# Patient Record
Sex: Male | Born: 1970 | Race: White | Hispanic: No | Marital: Married | Smoking: Former smoker
Health system: Southern US, Community
[De-identification: ages and names within clinical notes are randomized; demographics above are authoritative.]

## PROBLEM LIST (undated history)

## (undated) DIAGNOSIS — I71019 Dissection of thoracic aorta, unspecified: Secondary | ICD-10-CM

## (undated) DIAGNOSIS — J449 Chronic obstructive pulmonary disease, unspecified: Secondary | ICD-10-CM

## (undated) DIAGNOSIS — R011 Cardiac murmur, unspecified: Secondary | ICD-10-CM

## (undated) DIAGNOSIS — E119 Type 2 diabetes mellitus without complications: Secondary | ICD-10-CM

## (undated) DIAGNOSIS — R0600 Dyspnea, unspecified: Secondary | ICD-10-CM

## (undated) DIAGNOSIS — I10 Essential (primary) hypertension: Secondary | ICD-10-CM

## (undated) HISTORY — PX: HAND SURGERY: SHX662

## (undated) HISTORY — DX: Dissection of thoracic aorta, unspecified: I71.019

---

## 1998-05-15 ENCOUNTER — Encounter: Admission: RE | Admit: 1998-05-15 | Discharge: 1998-05-15 | Payer: Self-pay | Admitting: *Deleted

## 2013-07-08 HISTORY — PX: CORONARY ARTERY BYPASS GRAFT: SHX141

## 2016-11-06 ENCOUNTER — Encounter (HOSPITAL_BASED_OUTPATIENT_CLINIC_OR_DEPARTMENT_OTHER): Payer: Self-pay

## 2016-11-06 DIAGNOSIS — R0683 Snoring: Secondary | ICD-10-CM

## 2016-11-06 DIAGNOSIS — G4733 Obstructive sleep apnea (adult) (pediatric): Secondary | ICD-10-CM

## 2016-12-29 ENCOUNTER — Ambulatory Visit (HOSPITAL_BASED_OUTPATIENT_CLINIC_OR_DEPARTMENT_OTHER): Payer: BLUE CROSS/BLUE SHIELD | Attending: Family Medicine | Admitting: Internal Medicine

## 2016-12-29 DIAGNOSIS — R0683 Snoring: Secondary | ICD-10-CM | POA: Diagnosis present

## 2016-12-29 DIAGNOSIS — G4730 Sleep apnea, unspecified: Secondary | ICD-10-CM | POA: Insufficient documentation

## 2016-12-29 DIAGNOSIS — G4733 Obstructive sleep apnea (adult) (pediatric): Secondary | ICD-10-CM

## 2017-01-03 DIAGNOSIS — R0683 Snoring: Secondary | ICD-10-CM | POA: Diagnosis not present

## 2017-01-03 NOTE — Procedures (Signed)
Patient Name: Patrick Woodard, Patrick Woodard Date: 12/29/2016 Gender: Male D.O.B: 01/30/1971 Age (years): 45 Referring Provider: Addison Naegeli MD Height (inches): 68 Interpreting Physician: Baird Lyons MD, ABSM Weight (lbs): 210 RPSGT: Laren Everts BMI: 29 MRN: [id number] Neck Size: 16.50 CLINICAL INFORMATION Sleep Study Type: Split Night CPAP  Indication for sleep study: Fatigue, OSA, Snoring, Witnessed Apneas  Epworth Sleepiness Score: 21  SLEEP STUDY TECHNIQUE As per the AASM Manual for the Scoring of Sleep and Associated Events v2.3 (April 2016) with a hypopnea requiring 4% desaturations.  The channels recorded and monitored were frontal, central and occipital EEG, electrooculogram (EOG), submentalis EMG (chin), nasal and oral airflow, thoracic and abdominal wall motion, anterior tibialis EMG, snore microphone, electrocardiogram, and pulse oximetry. Continuous positive airway pressure (CPAP) was initiated when the patient met split night criteria and was titrated according to treat sleep-disordered breathing.  MEDICATIONS Medications self-administered by patient taken the night of the study : none reported  RESPIRATORY PARAMETERS Diagnostic  Total AHI (/hr): 11.5 RDI (/hr): 22.0 OA Index (/hr): - CA Index (/hr): 6.2 REM AHI (/hr): N/A NREM AHI (/hr): 11.5 Supine AHI (/hr): 15.1 Non-supine AHI (/hr): 7.74 Min O2 Sat (%): 85.00 Mean O2 (%): 91.56 Time below 88% (min): 20.6   Titration  Optimal Pressure (cm): 13 AHI at Optimal Pressure (/hr): 21.6 Min O2 at Optimal Pressure (%): 92.0 Supine % at Optimal (%): 54 Sleep % at Optimal (%): 97   SLEEP ARCHITECTURE The recording time for the entire night was 416.3 minutes.  During a baseline period of 146.4 minutes, the patient slept for 125.5 minutes in REM and nonREM, yielding a sleep efficiency of 85.7%. Sleep onset after lights out was 7.5 minutes with a REM latency of N/A minutes. The patient spent 13.94% of the night in stage  N1 sleep, 86.06% in stage N2 sleep, 0.00% in stage N3 and 0.00% in REM.  During the titration period of 263.4 minutes, the patient slept for 212.5 minutes in REM and nonREM, yielding a sleep efficiency of 80.7%. Sleep onset after CPAP initiation was 10.6 minutes with a REM latency of 31.5 minutes. The patient spent 19.06% of the night in stage N1 sleep, 47.06% in stage N2 sleep, 0.00% in stage N3 and 33.88% in REM.  CARDIAC DATA The 2 lead EKG demonstrated sinus rhythm. The mean heart rate was 70.09 beats per minute. Other EKG findings include: PVCs.  LEG MOVEMENT DATA The total Periodic Limb Movements of Sleep (PLMS) were 0. The PLMS index was 0.00 .  IMPRESSIONS - Mild obstructive sleep apnea occurred during the diagnostic portion of the study (AHI = 11.5 /hour). An optimal PAP pressure was selected for this patient ( 13 cm of water) - Mild central sleep apnea occurred during the diagnostic portion of the study (CAI = 6.2/hour). - The patient had minimal  oxygen desaturation during the diagnostic portion of the study (Min O2 = 85.00%) - No snoring was audible during the diagnostic portion of the study. - EKG findings include PVCs. - Clinically significant periodic limb movements did not occur during sleep.  DIAGNOSIS - Obstructive Sleep Apnea (327.23 [G47.33 ICD-10])  RECOMMENDATIONS - Trial of CPAP therapy on 13 cm H2O with a Medium size Fisher&Paykel Full Face Mask Simplus mask and heated humidification. - Avoid alcohol, sedatives and other CNS depressants that may worsen sleep apnea and disrupt normal sleep architecture. - Sleep hygiene should be reviewed to assess factors that may improve sleep quality. - Weight management and regular exercise should be  initiated or continued.  [Electronically signed] 01/03/2017 11:26 AM  Baird Lyons MD, ABSM Diplomate, American Board of Sleep Medicine   NPI: [id number]  [place], American Board of Sleep  Medicine  ELECTRONICALLY SIGNED ON:  01/03/2017, 11:25 AM [place] PH: [phone]   FX: [phone] [place]

## 2020-09-05 ENCOUNTER — Ambulatory Visit: Payer: Self-pay | Admitting: Surgery

## 2020-12-06 ENCOUNTER — Other Ambulatory Visit (HOSPITAL_COMMUNITY)
Admission: RE | Admit: 2020-12-06 | Discharge: 2020-12-06 | Disposition: A | Discharge status: Home / Self Care | Payer: BC Managed Care – PPO | Source: Ambulatory Visit | Attending: Surgery | Admitting: Surgery

## 2020-12-06 DIAGNOSIS — Z01812 Encounter for preprocedural laboratory examination: Secondary | ICD-10-CM | POA: Diagnosis not present

## 2020-12-06 DIAGNOSIS — Z20822 Contact with and (suspected) exposure to covid-19: Secondary | ICD-10-CM | POA: Insufficient documentation

## 2020-12-07 LAB — SARS CORONAVIRUS 2 (TAT 6-24 HRS): SARS Coronavirus 2: NEGATIVE

## 2020-12-09 ENCOUNTER — Encounter (HOSPITAL_COMMUNITY): Payer: Self-pay | Admitting: Surgery

## 2020-12-09 NOTE — Anesthesia Preprocedure Evaluation (Addendum)
Anesthesia Evaluation  Patient identified by MRN, date of birth, ID band Patient awake    Reviewed: Allergy & Precautions, NPO status , Patient's Chart, lab work & pertinent test results, reviewed documented beta blocker date and time   History of Anesthesia Complications Negative for: history of anesthetic complications  Airway Mallampati: II  TM Distance: >3 FB Neck ROM: Full    Dental  (+) Dental Advisory Given, Caps, Missing   Pulmonary sleep apnea and Continuous Positive Airway Pressure Ventilation , COPD, former smoker,  12/06/2020 SARS coronavirus NEG   breath sounds clear to auscultation       Cardiovascular hypertension, Pt. on medications and Pt. on home beta blockers (-) angina+ Peripheral Vascular Disease (s/p Type A aortic dissection: repaired)  (-) CABG  Rhythm:Regular Rate:Normal  '21 ECHO: EF 55-60%, mild AI   Neuro/Psych negative neurological ROS     GI/Hepatic negative GI ROS, Neg liver ROS,   Endo/Other  diabetes  Renal/GU negative Renal ROS     Musculoskeletal   Abdominal   Peds  Hematology negative hematology ROS (+)   Anesthesia Other Findings   Reproductive/Obstetrics                          Anesthesia Physical Anesthesia Plan  ASA: III  Anesthesia Plan: General   Post-op Pain Management:    Induction: Intravenous  PONV Risk Score and Plan: 2 and Ondansetron and Dexamethasone  Airway Management Planned: Oral ETT  Additional Equipment: None  Intra-op Plan:   Post-operative Plan: Extubation in OR  Informed Consent: I have reviewed the patients History and Physical, chart, labs and discussed the procedure including the risks, benefits and alternatives for the proposed anesthesia with the patient or authorized representative who has indicated his/her understanding and acceptance.     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  Last seen 03/19/2020.  At that time he had surveillance CT chest abdomen pelvis that was stable, recommended 1 year imaging follow-up.  He has a chronic cough that is followed by his PCP.  Etiology unclear, he is being treated for the allergic component with montelukast.  OSA on CPAP.  History of resting tachycardia, maintained on sotalol.  DM2, last A1c 7.5 on 06/10/2020.  Patient will need day of surgery labs and evaluation.  CTA chest abdomen pelvis 03/19/2020 (Care Everywhere): Compared to CT CAP 08/15/2019;  1. Similar Stanford A aortic dissection status post graft repair of the ascending aorta. Overall similar appearance with extension of the dissection flap into the brachiocephalic and right common carotid arteries and the maximal diameter of the descending thoracic aorta measuring 4.0 cm.  2. The abdominal extent of the aortic dissection appears unchanged, as detailed above.  Carotid duplex 12/06/2019 (Care Everywhere): CONCLUSION:  There is clearly a dissection entering the right common carotid artery. Right-sided systolic flow velocities are slightly diminished but still within normal limits. No significant flow limitation detected.   Antegrade flow within both vertebral arteries.  TTE 03/08/2020 (Care Everywhere): SUMMARY  The left ventricular size is normal.  There is normal left ventricular wall thickness.  Left ventricular systolic function is normal.  LV ejection fraction = 55-60%.  Left ventricular filling pattern is normal.  The left ventricular wall  motion is normal.  The right ventricle is mildly dilated.  The right ventricular systolic function is normal.  There is mild aortic regurgitation.  Mild pulmonic valvular  regurgitation.  The aortic sinus is normal size.  IVC size was normal.  There is no pericardial effusion.  )      Anesthesia Quick Evaluation

## 2020-12-09 NOTE — Progress Notes (Signed)
Patrick Woodard denies chest pain. Patrick Woodard states he does become short of breath with activity, this is not new, patient reports that Dr. records list COPD as a problem. Patient was tested for Covid and has been in quarantine since that time.

## 2020-12-09 NOTE — Progress Notes (Signed)
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  Last seen 03/19/2020.  At that time he had surveillance CT chest abdomen pelvis that was stable, recommended 1 year imaging follow-up.  He has a chronic cough that is followed by his PCP.  Etiology unclear, he is being treated for the allergic component with montelukast.  OSA on CPAP.  History of resting tachycardia, maintained on sotalol.  DM2, last A1c 7.5 on 06/10/2020.  Patient will need day of surgery labs and evaluation.  CTA chest abdomen pelvis 03/19/2020 (Care Everywhere): Compared to CT CAP 08/15/2019;  1. Similar Stanford A aortic dissection status post graft repair of the ascending aorta. Overall similar appearance with extension of the dissection flap into the brachiocephalic and right common carotid arteries and the maximal diameter of the descending thoracic aorta measuring 4.0 cm.  2. The abdominal extent of the aortic dissection appears unchanged, as detailed above.  Carotid duplex 12/06/2019 (Care Everywhere): CONCLUSION:  There is clearly a dissection entering the right common carotid artery. Right-sided systolic flow velocities are slightly diminished but still within normal limits. No significant flow limitation detected.   Antegrade flow within both vertebral arteries.  TTE 03/08/2020 (Care Everywhere): SUMMARY  The left ventricular size is normal.  There is normal left ventricular wall thickness.  Left ventricular systolic function is normal.  LV ejection fraction = 55-60%.  Left ventricular filling pattern is normal.  The left ventricular wall motion is normal.  The right ventricle is mildly dilated.  The right ventricular systolic function is normal.  There is mild aortic regurgitation.  Mild pulmonic valvular regurgitation.  The aortic  sinus is normal size.  IVC size was normal.  There is no pericardial effusion.    Zannie Cove [facility] Short Stay Center/Anesthesiology Phone [phone] 12/09/2020 2:54 PM

## 2020-12-10 ENCOUNTER — Encounter (HOSPITAL_COMMUNITY): Payer: Self-pay | Admitting: Surgery

## 2020-12-10 ENCOUNTER — Ambulatory Visit (HOSPITAL_COMMUNITY): Payer: BC Managed Care – PPO | Admitting: Certified Registered"

## 2020-12-10 ENCOUNTER — Other Ambulatory Visit: Payer: Self-pay

## 2020-12-10 ENCOUNTER — Ambulatory Visit (HOSPITAL_COMMUNITY)
Admission: RE | Admit: 2020-12-10 | Discharge: 2020-12-10 | Disposition: A | Discharge status: Home / Self Care | Payer: BC Managed Care – PPO | Attending: Surgery | Admitting: Surgery

## 2020-12-10 ENCOUNTER — Encounter (HOSPITAL_COMMUNITY)
Admission: RE | Disposition: A | Discharge status: Home / Self Care | Payer: Self-pay | Source: Home / Self Care | Attending: Surgery

## 2020-12-10 DIAGNOSIS — Z91040 Latex allergy status: Secondary | ICD-10-CM | POA: Diagnosis not present

## 2020-12-10 DIAGNOSIS — K420 Umbilical hernia with obstruction, without gangrene: Secondary | ICD-10-CM | POA: Diagnosis present

## 2020-12-10 DIAGNOSIS — Z87891 Personal history of nicotine dependence: Secondary | ICD-10-CM | POA: Insufficient documentation

## 2020-12-10 DIAGNOSIS — E119 Type 2 diabetes mellitus without complications: Secondary | ICD-10-CM | POA: Insufficient documentation

## 2020-12-10 HISTORY — DX: Dyspnea, unspecified: R06.00

## 2020-12-10 HISTORY — DX: Type 2 diabetes mellitus without complications: E11.9

## 2020-12-10 HISTORY — DX: Chronic obstructive pulmonary disease, unspecified: J44.9

## 2020-12-10 HISTORY — DX: Cardiac murmur, unspecified: R01.1

## 2020-12-10 HISTORY — PX: UMBILICAL HERNIA REPAIR: SHX196

## 2020-12-10 HISTORY — DX: Essential (primary) hypertension: I10

## 2020-12-10 LAB — CBC
HCT: 40.3 % (ref 39.0–52.0)
Hemoglobin: 14 g/dL (ref 13.0–17.0)
MCH: 32.6 pg (ref 26.0–34.0)
MCHC: 34.7 g/dL (ref 30.0–36.0)
MCV: 93.7 fL (ref 80.0–100.0)
Platelets: 194 10*3/uL (ref 150–400)
RBC: 4.3 MIL/uL (ref 4.22–5.81)
RDW: 12.7 % (ref 11.5–15.5)
WBC: 6.9 10*3/uL (ref 4.0–10.5)
nRBC: 0 % (ref 0.0–0.2)

## 2020-12-10 LAB — BASIC METABOLIC PANEL
Anion gap: 8 (ref 5–15)
BUN: 12 mg/dL (ref 6–20)
CO2: 23 mmol/L (ref 22–32)
Calcium: 8.6 mg/dL — ABNORMAL LOW (ref 8.9–10.3)
Chloride: 105 mmol/L (ref 98–111)
Creatinine, Ser: 0.94 mg/dL (ref 0.61–1.24)
GFR, Estimated: >60 mL/min (ref >60)
Glucose, Bld: 206 mg/dL — ABNORMAL HIGH (ref 70–99)
Potassium: 4.4 mmol/L (ref 3.5–5.1)
Sodium: 136 mmol/L (ref 135–145)

## 2020-12-10 LAB — GLUCOSE, CAPILLARY: Glucose-Capillary: 223 mg/dL — ABNORMAL HIGH (ref 70–99)

## 2020-12-10 SURGERY — REPAIR, HERNIA, UMBILICAL, LAPAROSCOPIC
Anesthesia: General

## 2020-12-10 MED ORDER — PROPOFOL 10 MG/ML IV BOLUS
INTRAVENOUS | Status: AC
  Filled 2020-12-10: qty 20

## 2020-12-10 MED ORDER — ACETAMINOPHEN 500 MG PO TABS: 1000.0000 mg | ORAL_TABLET | Freq: Once | ORAL | Status: AC

## 2020-12-10 MED ORDER — HYDROMORPHONE HCL 1 MG/ML IJ SOLN
INTRAMUSCULAR | Status: AC
  Filled 2020-12-10: qty 1

## 2020-12-10 MED ORDER — MIDAZOLAM HCL 2 MG/2ML IJ SOLN
0.5000 mg | Freq: Once | INTRAMUSCULAR | Status: DC | PRN
Start: 2020-12-10 — End: 2020-12-10

## 2020-12-10 MED ORDER — KETOROLAC TROMETHAMINE 10 MG PO TABS: 10.0000 mg | ORAL_TABLET | Freq: Four times a day (QID) | ORAL | 0 refills | Status: DC

## 2020-12-10 MED ORDER — ROCURONIUM BROMIDE 10 MG/ML (PF) SYRINGE
PREFILLED_SYRINGE | INTRAVENOUS | Status: DC | PRN
  Administered 2020-12-10: 60 mg via INTRAVENOUS
  Administered 2020-12-10: 10 mg via INTRAVENOUS

## 2020-12-10 MED ORDER — OXYCODONE HCL 5 MG PO TABS
ORAL_TABLET | ORAL | Status: AC
  Filled 2020-12-10: qty 1

## 2020-12-10 MED ORDER — PROPOFOL 10 MG/ML IV BOLUS
INTRAVENOUS | Status: DC | PRN
  Administered 2020-12-10: 120 mg via INTRAVENOUS

## 2020-12-10 MED ORDER — OXYCODONE HCL 5 MG/5ML PO SOLN: 5.0000 mg | Freq: Once | ORAL | Status: AC | PRN

## 2020-12-10 MED ORDER — ENOXAPARIN SODIUM 40 MG/0.4ML ~~LOC~~ SOLN
SUBCUTANEOUS | Status: AC
  Administered 2020-12-10: 40 mg via SUBCUTANEOUS
  Filled 2020-12-10: qty 0.4

## 2020-12-10 MED ORDER — ACETAMINOPHEN 500 MG PO TABS
ORAL_TABLET | ORAL | Status: AC
  Administered 2020-12-10: 1000 mg via ORAL
  Filled 2020-12-10: qty 2

## 2020-12-10 MED ORDER — CEFAZOLIN SODIUM-DEXTROSE 2-4 GM/100ML-% IV SOLN
2.0000 g | INTRAVENOUS | Status: AC
  Administered 2020-12-10: 2 g via INTRAVENOUS

## 2020-12-10 MED ORDER — CHLORHEXIDINE GLUCONATE CLOTH 2 % EX PADS: 6.0000 | MEDICATED_PAD | Freq: Once | CUTANEOUS | Status: DC

## 2020-12-10 MED ORDER — BUPIVACAINE LIPOSOME 1.3 % IJ SUSP
INTRAMUSCULAR | Status: DC | PRN
  Administered 2020-12-10: 3 mL

## 2020-12-10 MED ORDER — PROMETHAZINE HCL 25 MG/ML IJ SOLN
INTRAMUSCULAR | Status: AC
  Filled 2020-12-10: qty 1

## 2020-12-10 MED ORDER — FENTANYL CITRATE (PF) 250 MCG/5ML IJ SOLN
INTRAMUSCULAR | Status: AC
  Filled 2020-12-10: qty 5

## 2020-12-10 MED ORDER — LIDOCAINE 2% (20 MG/ML) 5 ML SYRINGE
INTRAMUSCULAR | Status: DC | PRN
  Administered 2020-12-10: 40 mg via INTRAVENOUS

## 2020-12-10 MED ORDER — OXYCODONE HCL 5 MG PO TABS
5.0000 mg | ORAL_TABLET | Freq: Once | ORAL | Status: AC | PRN
  Administered 2020-12-10: 5 mg via ORAL

## 2020-12-10 MED ORDER — SUGAMMADEX SODIUM 200 MG/2ML IV SOLN
INTRAVENOUS | Status: DC | PRN
  Administered 2020-12-10: 200 mg via INTRAVENOUS

## 2020-12-10 MED ORDER — MIDAZOLAM HCL 2 MG/2ML IJ SOLN
INTRAMUSCULAR | Status: AC
  Filled 2020-12-10: qty 2

## 2020-12-10 MED ORDER — ONDANSETRON HCL 4 MG/2ML IJ SOLN
INTRAMUSCULAR | Status: AC
  Filled 2020-12-10: qty 2

## 2020-12-10 MED ORDER — OXYCODONE HCL 5 MG PO TABS: 5.0000 mg | ORAL_TABLET | ORAL | 0 refills | Status: DC | PRN

## 2020-12-10 MED ORDER — MIDAZOLAM HCL 5 MG/5ML IJ SOLN
INTRAMUSCULAR | Status: DC | PRN
  Administered 2020-12-10: 2 mg via INTRAVENOUS

## 2020-12-10 MED ORDER — CEFAZOLIN SODIUM-DEXTROSE 2-4 GM/100ML-% IV SOLN
INTRAVENOUS | Status: AC
  Filled 2020-12-10: qty 100

## 2020-12-10 MED ORDER — FENTANYL CITRATE (PF) 100 MCG/2ML IJ SOLN
INTRAMUSCULAR | Status: DC | PRN
  Administered 2020-12-10: 50 ug via INTRAVENOUS
  Administered 2020-12-10: 100 ug via INTRAVENOUS
  Administered 2020-12-10 (×2): 50 ug via INTRAVENOUS

## 2020-12-10 MED ORDER — DEXAMETHASONE SODIUM PHOSPHATE 10 MG/ML IJ SOLN
INTRAMUSCULAR | Status: DC | PRN
  Administered 2020-12-10: 10 mg via INTRAVENOUS

## 2020-12-10 MED ORDER — ONDANSETRON HCL 4 MG/2ML IJ SOLN
INTRAMUSCULAR | Status: DC | PRN
  Administered 2020-12-10: 4 mg via INTRAVENOUS

## 2020-12-10 MED ORDER — ORAL CARE MOUTH RINSE: 15.0000 mL | Freq: Once | OROMUCOSAL | Status: AC

## 2020-12-10 MED ORDER — HYDROMORPHONE HCL 1 MG/ML IJ SOLN
0.2500 mg | INTRAMUSCULAR | Status: DC | PRN
  Administered 2020-12-10 (×2): 0.25 mg via INTRAVENOUS

## 2020-12-10 MED ORDER — ROCURONIUM BROMIDE 10 MG/ML (PF) SYRINGE
PREFILLED_SYRINGE | INTRAVENOUS | Status: AC
  Filled 2020-12-10: qty 10

## 2020-12-10 MED ORDER — PROMETHAZINE HCL 25 MG/ML IJ SOLN
6.2500 mg | INTRAMUSCULAR | Status: DC | PRN
  Administered 2020-12-10: 12.5 mg via INTRAVENOUS

## 2020-12-10 MED ORDER — BUPIVACAINE-EPINEPHRINE (PF) 0.25% -1:200000 IJ SOLN
INTRAMUSCULAR | Status: AC
  Filled 2020-12-10: qty 30

## 2020-12-10 MED ORDER — LACTATED RINGERS IV SOLN: INTRAVENOUS | Status: DC

## 2020-12-10 MED ORDER — METHOCARBAMOL 750 MG PO TABS: 750.0000 mg | ORAL_TABLET | Freq: Four times a day (QID) | ORAL | 1 refills | Status: DC

## 2020-12-10 MED ORDER — BUPIVACAINE-EPINEPHRINE 0.25% -1:200000 IJ SOLN
INTRAMUSCULAR | Status: DC | PRN
  Administered 2020-12-10: 3 mL

## 2020-12-10 MED ORDER — ENOXAPARIN SODIUM 40 MG/0.4ML ~~LOC~~ SOLN: 40.0000 mg | Freq: Once | SUBCUTANEOUS | Status: AC

## 2020-12-10 MED ORDER — DEXAMETHASONE SODIUM PHOSPHATE 10 MG/ML IJ SOLN
INTRAMUSCULAR | Status: AC
  Filled 2020-12-10: qty 1

## 2020-12-10 MED ORDER — CHLORHEXIDINE GLUCONATE 0.12 % MT SOLN
15.0000 mL | Freq: Once | OROMUCOSAL | Status: AC
  Administered 2020-12-10: 15 mL via OROMUCOSAL
  Filled 2020-12-10: qty 15

## 2020-12-10 MED ORDER — BUPIVACAINE LIPOSOME 1.3 % IJ SUSP
INTRAMUSCULAR | Status: AC
  Filled 2020-12-10: qty 20

## 2020-12-10 MED ORDER — DOCUSATE SODIUM 100 MG PO CAPS: 100.0000 mg | ORAL_CAPSULE | Freq: Two times a day (BID) | ORAL | 2 refills | Status: AC

## 2020-12-10 MED ORDER — LIDOCAINE 2% (20 MG/ML) 5 ML SYRINGE
INTRAMUSCULAR | Status: AC
  Filled 2020-12-10: qty 5

## 2020-12-10 MED ORDER — MEPERIDINE HCL 25 MG/ML IJ SOLN: 6.2500 mg | INTRAMUSCULAR | Status: DC | PRN

## 2020-12-10 SURGICAL SUPPLY — 29 items
BINDER ABDOMINAL 12 ML 46-62 (SOFTGOODS) ×2 IMPLANT
CHLORAPREP W/TINT 26 (MISCELLANEOUS) ×2 IMPLANT
COVER SURGICAL LIGHT HANDLE (MISCELLANEOUS) ×2 IMPLANT
DERMABOND ADHESIVE PROPEN (GAUZE/BANDAGES/DRESSINGS) ×1
DERMABOND ADVANCED .7 DNX6 (GAUZE/BANDAGES/DRESSINGS) ×1 IMPLANT
DEVICE SECURE STRAP 25 ABSORB (INSTRUMENTS) ×4 IMPLANT
DEVICE TROCAR PUNCTURE CLOSURE (ENDOMECHANICALS) ×2 IMPLANT
DRAPE INCISE IOBAN 66X45 STRL (DRAPES) ×2 IMPLANT
ELECT REM PT RETURN 9FT ADLT (ELECTROSURGICAL) ×2
ELECTRODE REM PT RTRN 9FT ADLT (ELECTROSURGICAL) ×1 IMPLANT
GLOVE BIO SURGEON STRL SZ 6.5 (GLOVE) ×2 IMPLANT
GLOVE BIOGEL PI IND STRL 6 (GLOVE) ×1 IMPLANT
GLOVE BIOGEL PI INDICATOR 6 (GLOVE) ×1
GOWN STRL REUS W/ TWL LRG LVL3 (GOWN DISPOSABLE) ×3 IMPLANT
GOWN STRL REUS W/TWL LRG LVL3 (GOWN DISPOSABLE) ×6
KIT BASIN OR (CUSTOM PROCEDURE TRAY) ×2 IMPLANT
KIT TURNOVER KIT B (KITS) ×2 IMPLANT
MARKER SKIN DUAL TIP RULER LAB (MISCELLANEOUS) ×2 IMPLANT
MESH VENTRALIGHT ST 4.5IN (Mesh General) ×2 IMPLANT
NEEDLE INSUFFLATION 14GA 120MM (NEEDLE) ×2 IMPLANT
NS IRRIG 1000ML POUR BTL (IV SOLUTION) ×2 IMPLANT
PAD ARMBOARD 7.5X6 YLW CONV (MISCELLANEOUS) ×2 IMPLANT
SET TUBE SMOKE EVAC HIGH FLOW (TUBING) ×2 IMPLANT
SLEEVE ENDOPATH XCEL 5M (ENDOMECHANICALS) ×4 IMPLANT
SUT MNCRL AB 4-0 PS2 18 (SUTURE) ×2 IMPLANT
SUT NOVA NAB DX-16 0-1 5-0 T12 (SUTURE) ×2 IMPLANT
TOWEL GREEN STERILE (TOWEL DISPOSABLE) ×2 IMPLANT
TRAY LAPAROSCOPIC MC (CUSTOM PROCEDURE TRAY) ×2 IMPLANT
TROCAR XCEL NON-BLD 5MMX100MML (ENDOMECHANICALS) ×2 IMPLANT

## 2020-12-10 NOTE — Anesthesia Procedure Notes (Signed)
Procedure Name: Intubation Date/Time: 12/10/2020 7:35 AM Performed by: Moshe Salisbury, CRNA Pre-anesthesia Checklist: Patient identified, Emergency Drugs available, Suction available and Patient being monitored Patient Re-evaluated:Patient Re-evaluated prior to induction Oxygen Delivery Method: Circle System Utilized Preoxygenation: Pre-oxygenation with 100% oxygen Induction Type: IV induction Ventilation: Mask ventilation without difficulty Laryngoscope Size: Mac and 4 Grade View: Grade I Tube type: Oral Tube size: 8.0 mm Number of attempts: 1 Airway Equipment and Method: Stylet Placement Confirmation: ETT inserted through vocal cords under direct vision,  positive ETCO2 and breath sounds checked- equal and bilateral Secured at: 22 cm Tube secured with: Tape Dental Injury: Teeth and Oropharynx as per pre-operative assessment

## 2020-12-10 NOTE — Transfer of Care (Signed)
Immediate Anesthesia Transfer of Care Note  Patient: Patrick Woodard  Procedure(s) Performed: LAPAROSCOPIC UMBILICAL HERNIA REPAIR WITH MESH (N/A )  Patient Location: PACU  Anesthesia Type:General  Level of Consciousness: drowsy and patient cooperative  Airway & Oxygen Therapy: Patient Spontanous Breathing and Patient connected to nasal cannula oxygen  Post-op Assessment: Report given to RN, Post -op Vital signs reviewed and stable and Patient moving all extremities  Post vital signs: Reviewed and stable  Last Vitals:  Vitals Value Taken Time  BP 143/90 12/10/20 0859  Temp    Pulse 68 12/10/20 0900  Resp 15 12/10/20 0900  SpO2 93 % 12/10/20 0900  Vitals shown include unvalidated device data.  Last Pain:  Vitals:   12/10/20 0635  TempSrc:   PainSc: 0-No pain      Patients Stated Pain Goal: 2 (12/10/20 9292)  Complications: No complications documented.

## 2020-12-10 NOTE — Op Note (Signed)
    Operative Note     Date: 12/10/2020   Procedure: laparoscopic umbilical hernia repair with mesh   Pre-op diagnosis: incarcerated umbilical hernia Post-op diagnosis: same   Indication and clinical history: 55M with incarcerated umbilical hernia     Surgeon: Diamantina Monks, MD   Anesthesiologist: Jean Rosenthal, MD Anesthesia: General   Findings:   Specimen: none  EBL: <5cc  Drains/Implants: 4.5in circular Ventralight ST permanent mesh   Disposition: PACU - hemodynamically stable.   Description of procedure: The patient was positioned supine on the operating room table. General anesthetic induction and intubation were uneventful. Time-out was performed verifying correct patient, procedure, signature of informed consent, and administration of pre-operative antibiotics. The abdomen was prepped and draped in the usual sterile fashion, including the use of an ioban drape.    A Veress needle was used in the left upper quadrant to insufflate the abdomen and the abdomen entered using a 50mm port and an optiview technique. No injury to any intra-abdominal structure was identified. Two additional 54mm ports were placed in the left abdomen under direct visualization and after administration of local anesthetic mixed with exparel. The incarcerated fat was resected and a 4.5in circular mesh was labeled in four quadrants and #1 novofil suture secured to it. The mesh was inserted into the abdomen. After orientation in the abdomen, a suture passer was used to secure the mesh in four quadrants and a 14mm absorbable tacker used to circumferentially secure it. The skin of all port sites were closed with 4-0 monocryl suture. All wounds were dressed with dermabond as sterile dressing.    All sponge and instrument counts were correct at the conclusion of the procedure. The patient was awakened from anesthesia, extubated uneventfully, and transported to the PACU in good condition. There were no complications.       Diamantina Monks, MD General and Trauma Surgery [place] [facility] Surgery

## 2020-12-10 NOTE — Anesthesia Postprocedure Evaluation (Signed)
Anesthesia Post Note  Patient: Suliman Termini  Procedure(s) Performed: LAPAROSCOPIC UMBILICAL HERNIA REPAIR WITH MESH (N/A )     Patient location during evaluation: PACU Anesthesia Type: General Level of consciousness: awake and alert, patient cooperative and oriented Pain management: pain level controlled Vital Signs Assessment: post-procedure vital signs reviewed and stable Respiratory status: spontaneous breathing, nonlabored ventilation and respiratory function stable Cardiovascular status: blood pressure returned to baseline and stable Postop Assessment: no apparent nausea or vomiting and able to ambulate Anesthetic complications: no   No complications documented.  Last Vitals:  Vitals:   12/10/20 1000 12/10/20 1015  BP: 132/79 129/75  Pulse: 60 61  Resp: 19 20  Temp:  36.5 C  SpO2: 93% 93%    Last Pain:  Vitals:   12/10/20 1000  TempSrc:   PainSc: Asleep                 Eivan Gallina,E. Ivanna Kocak

## 2020-12-10 NOTE — H&P (Signed)
    Patrick Woodard is an 50 y.o. male.   HPI: 21M with symptomatic, incarcerated umbilical hernia. Plan for lap UHR today with mesh. The patient has had no hospitalizations, ER visits, surgeries, or newly diagnosed allergies since being seen in the office. The patient was seen by his PCP for a new cough, completed a one week course of prednisone, which has improved his symptoms, and has been restarted on montelukast in addition to his year-round, daily loratadine. The plan per the patient is for his PCP to change his ACE-i, presuming that to be the etiology of his cough.    Past Medical History:  Diagnosis Date  . COPD (chronic obstructive pulmonary disease) (HCC)   . Diabetes mellitus without complication (HCC)    patient denies  . Dyspnea    with activity -   . Heart murmur   . Hypertension     Past Surgical History:  Procedure Laterality Date  . 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 HAND SURGERY Left    injury    History reviewed. No pertinent family history.  Social History:  reports that he quit smoking about 30 years ago. He has never used smokeless tobacco. He reports previous alcohol use. He reports previous drug use.  Allergies:  Allergies  Allergen Reactions  . Latex Dermatitis and Rash    Medications: I have reviewed the patient's current medications.  No results found for this or any previous visit (from the past 48 hour(s)).  No results found.  ROS 10 point review of systems is negative except as listed above in HPI.   Physical Exam Blood pressure 126/81, pulse 62, temperature 97.8 F (36.6 C), temperature source Oral, resp. rate 17, height 6' (1.829 m), weight 101.2 kg, SpO2 96 %. Constitutional: well-developed, well-nourished HEENT: pupils equal, round, reactive to light, 29mm b/l, moist conjunctiva, external inspection of ears and nose normal, hearing intact Oropharynx: normal oropharyngeal mucosa, normal dentition Neck: no  thyromegaly, trachea midline, no midline cervical tenderness to palpation Chest: breath sounds equal bilaterally, normal respiratory effort, no midline or lateral chest wall tenderness to palpation/deformity Abdomen: soft, NT, small, incarcerated, easily reducible umbilical hernia, no bruising, no hepatosplenomegaly GU: no blood at urethral meatus of penis, no scrotal masses or abnormality  Back: no wounds, no thoracic/lumbar spine tenderness to palpation, no thoracic/lumbar spine stepoffs Rectal: deferred Extremities: 2+ radial and pedal pulses bilaterally, motor and sensation intact to bilateral UE and LE, no peripheral edema MSK: normal gait/station, no clubbing/cyanosis of fingers/toes, normal ROM of all four extremities Skin: warm, dry, no rashes Psych: normal memory, normal mood/affect    Assessment/Plan: 21M with incarcerated umbilical hernia. Plan for lap UHR with mesh today. Discussed the increased risk of recurrence in the setting of cough, however the hernia is small and there is a good plan to improve his cough in the works. I will discuss with his PCP for earlier transition off lisinopril (as early as today) to hopefully reduce the risk of cough. Last dose of lisinopril was 12/09/2020. Patient and I have discussed his lifting restrictions post-operatively. Informed consent was obtained after detailed explanation of additional risks, including bleeding, infection, hematoma/seroma, and mesh infection requiring explantation. All questions answered to the patient's satisfaction.   Diamantina Monks, MD General and Trauma Surgery [facility] Surgery

## 2020-12-11 ENCOUNTER — Encounter (HOSPITAL_COMMUNITY): Payer: Self-pay | Admitting: Surgery

## 2021-05-28 ENCOUNTER — Emergency Department (HOSPITAL_BASED_OUTPATIENT_CLINIC_OR_DEPARTMENT_OTHER): Payer: BC Managed Care – PPO

## 2021-05-28 ENCOUNTER — Other Ambulatory Visit: Payer: Self-pay

## 2021-05-28 ENCOUNTER — Encounter (HOSPITAL_BASED_OUTPATIENT_CLINIC_OR_DEPARTMENT_OTHER): Payer: Self-pay

## 2021-05-28 ENCOUNTER — Emergency Department (HOSPITAL_BASED_OUTPATIENT_CLINIC_OR_DEPARTMENT_OTHER)
Admission: EM | Admit: 2021-05-28 | Discharge: 2021-05-28 | Disposition: A | Discharge status: Home / Self Care | Payer: BC Managed Care – PPO | Attending: Emergency Medicine | Admitting: Emergency Medicine

## 2021-05-28 DIAGNOSIS — S0101XA Laceration without foreign body of scalp, initial encounter: Secondary | ICD-10-CM | POA: Diagnosis not present

## 2021-05-28 DIAGNOSIS — S0990XA Unspecified injury of head, initial encounter: Secondary | ICD-10-CM | POA: Diagnosis present

## 2021-05-28 DIAGNOSIS — Z91040 Latex allergy status: Secondary | ICD-10-CM | POA: Insufficient documentation

## 2021-05-28 DIAGNOSIS — Z7982 Long term (current) use of aspirin: Secondary | ICD-10-CM | POA: Insufficient documentation

## 2021-05-28 DIAGNOSIS — Z79899 Other long term (current) drug therapy: Secondary | ICD-10-CM | POA: Insufficient documentation

## 2021-05-28 DIAGNOSIS — I10 Essential (primary) hypertension: Secondary | ICD-10-CM | POA: Insufficient documentation

## 2021-05-28 DIAGNOSIS — E119 Type 2 diabetes mellitus without complications: Secondary | ICD-10-CM | POA: Insufficient documentation

## 2021-05-28 DIAGNOSIS — Y9389 Activity, other specified: Secondary | ICD-10-CM | POA: Diagnosis not present

## 2021-05-28 DIAGNOSIS — Z23 Encounter for immunization: Secondary | ICD-10-CM | POA: Insufficient documentation

## 2021-05-28 DIAGNOSIS — J449 Chronic obstructive pulmonary disease, unspecified: Secondary | ICD-10-CM | POA: Diagnosis not present

## 2021-05-28 DIAGNOSIS — W228XXA Striking against or struck by other objects, initial encounter: Secondary | ICD-10-CM | POA: Insufficient documentation

## 2021-05-28 DIAGNOSIS — Z87891 Personal history of nicotine dependence: Secondary | ICD-10-CM | POA: Diagnosis not present

## 2021-05-28 DIAGNOSIS — Z951 Presence of aortocoronary bypass graft: Secondary | ICD-10-CM | POA: Insufficient documentation

## 2021-05-28 MED ORDER — TETANUS-DIPHTH-ACELL PERTUSSIS 5-2.5-18.5 LF-MCG/0.5 IM SUSY
0.5000 mL | PREFILLED_SYRINGE | Freq: Once | INTRAMUSCULAR | Status: AC
  Administered 2021-05-28: 0.5 mL via INTRAMUSCULAR
  Filled 2021-05-28: qty 0.5

## 2021-05-28 MED ORDER — LIDOCAINE-EPINEPHRINE (PF) 2 %-1:200000 IJ SOLN
10.0000 mL | Freq: Once | INTRAMUSCULAR | Status: AC
  Administered 2021-05-28: 10 mL
  Filled 2021-05-28: qty 20

## 2021-05-28 MED ORDER — ACETAMINOPHEN 500 MG PO TABS
1000.0000 mg | ORAL_TABLET | Freq: Once | ORAL | Status: AC
  Administered 2021-05-28: 1000 mg via ORAL
  Filled 2021-05-28: qty 2

## 2021-05-28 NOTE — Discharge Instructions (Signed)
The staples need to come out in 7 to 10 days.  You can shower like normal.  You can use Tylenol as needed for pain.
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
## 2021-05-28 NOTE — ED Notes (Signed)
Patient left ED with ABCs intact, alert and oriented x4, respirations even and unlabored. Discharge instructions reviewed and all questions answered.   
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
## 2022-04-20 ENCOUNTER — Ambulatory Visit (INDEPENDENT_AMBULATORY_CARE_PROVIDER_SITE_OTHER): Payer: BC Managed Care – PPO | Admitting: Neurology

## 2022-04-20 ENCOUNTER — Encounter: Payer: Self-pay | Admitting: Neurology

## 2022-04-20 VITALS — BP 146/75 | HR 68 | Ht 71.0 in | Wt 219.0 lb

## 2022-04-20 DIAGNOSIS — G4733 Obstructive sleep apnea (adult) (pediatric): Secondary | ICD-10-CM | POA: Diagnosis not present

## 2022-04-20 DIAGNOSIS — Z9989 Dependence on other enabling machines and devices: Secondary | ICD-10-CM

## 2022-04-20 DIAGNOSIS — I71012 Dissection of descending thoracic aorta: Secondary | ICD-10-CM | POA: Diagnosis not present

## 2022-04-20 DIAGNOSIS — G4710 Hypersomnia, unspecified: Secondary | ICD-10-CM | POA: Diagnosis not present

## 2022-04-20 MED ORDER — ARMODAFINIL 150 MG PO TABS: 150.0000 mg | ORAL_TABLET | Freq: Every day | ORAL | 5 refills | Status: DC

## 2022-04-20 NOTE — Patient Instructions (Addendum)
Armodafinil Tablets What is this medication? ARMODAFINIL (ar moe DAF i nil) treats sleep disorders, such as narcolepsy, obstructive sleep apnea, and shift work disorder. It works by promoting wakefulness. It belongs to a group of medications called stimulants. This medicine may be used for other purposes; ask your health care provider or pharmacist if you have questions. COMMON BRAND NAME(S): Nuvigil What should I tell my care team before I take this medication? They need to know if you have any of these conditions: Depression Heart disease High blood pressure Kidney disease Liver disease Schizophrenia Substance use disorder Suicidal thoughts, plans, or attempt by you or a family member An unusual or allergic reaction to armodafinil, other medications, foods, dyes, or preservatives Pregnant or trying to get pregnant Breast-feeding How should I use this medication? Take this medication by mouth with water. Take it as directed on the prescription label at the same time every day. Keep taking this medication unless your care team tells you to stop. Stopping it too quickly can cause serious side effects. It can also make your condition worse. A special MedGuide will be given to you by the pharmacist with each prescription and refill. Be sure to read this information carefully each time. Talk to your care team about the use of this medication in children. While this medication may be prescribed for children as young as 17 years for selected conditions, precautions do apply. Overdosage: If you think you have taken too much of this medicine contact a poison control center or emergency room at once. NOTE: This medicine is only for you. Do not share this medicine with others. What if I miss a dose? If you miss a dose, take it as soon as you can. If it is almost time for your next dose, take only that dose. Do not take double or extra doses. What may interact with this medication? Do not take this  medication with any of the following: Amphetamine or dextroamphetamine Dexmethylphenidate or methylphenidate MAOIs, such as Marplan, Nardil, and Parnate Pemoline Procarbazine This medication may also interact with the following: Antifungal medications, such as itraconazole or ketoconazole Barbiturates, such as phenobarbital Carbamazepine Cyclosporine Diazepam Estrogen or progestin hormones Medications for mental health conditions Phenytoin Propranolol Triazolam Warfarin This list may not describe all possible interactions. Give your health care provider a list of all the medicines, herbs, non-prescription drugs, or dietary supplements you use. Also tell them if you smoke, drink alcohol, or use illegal drugs. Some items may interact with your medicine. What should I watch for while using this medication? Visit your care team for regular checks on your progress. It may be some time before you see the benefit from this medication. This medication may affect your coordination, reaction time, or judgment. It may also hide signs that you are tired. This medication will not eliminate your abnormal tendency to fall asleep and is not a replacement for sleep. Do not drive or operate machinery until you know how this medication affects you. Sit up or stand slowly to reduce the risk of dizzy or fainting spells. Drinking alcohol with this medication can increase the risk of these side effects. This medication may cause serious skin reactions. They can happen weeks to months after starting the medication. Contact your care team right away if you notice fevers or flu-like symptoms with a rash. The rash may be red or purple and then turn into blisters or peeling of the skin. You may also notice a red rash with swelling of the  face, lips, or lymph nodes in your neck or under your arms. Estrogen or progestin hormones may not work as well while you are taking this medication and for 1 month after stopping  treatment. If you are using these hormones for contraception, talk to your care team about using a second type of contraception. A barrier contraceptive, such as a condom or diaphragm, is recommended. It is unknown if the effects of this medication will be increased by the use of caffeine. Caffeine is found in many foods, beverages, and medications. Ask your care team if you should limit or change your intake of caffeine-containing products while on this medication. Do not stop previously prescribed treatments for your condition, such as a CPAP machine, except on the advice of your care team. What side effects may I notice from receiving this medication? Side effects that you should report to your care team as soon as possible: Allergic reactions or angioedema--skin rash, itching or hives, swelling of the face, eyes, lips, tongue, arms, or legs, trouble swallowing or breathing Increase in blood pressure Mood and behavior changes--anxiety, nervousness, confusion, hallucinations, irritability, hostility, thoughts of suicide or self-harm, worsening mood, feelings of depression Rash, fever, and swollen lymph nodes Redness, blistering, peeling, or loosening of the skin, including inside the mouth Side effects that usually do not require medical attention (report to your care team if they continue or are bothersome): Dizziness Headache Nausea Trouble sleeping This list may not describe all possible side effects. Call your doctor for medical advice about side effects. You may report side effects to FDA at 1-800-FDA-1088. 1)  mild OSA was in the past sufficiently treated with CPAP 13 cm water, his self reported loud snoring was reduced ( wife confirmed the presence of snoring by phone)  2) sleep quality was much improved on CPAP.  3) persistent Hypersomnia.  " I will fall asleep watching Tv every night.  4) AAA !    My Plan is to proceed with:  1) new baseline sleep study.  2) Rv in 2-3 months to  discuss treatment. 3) Modafinil ordered for Hypersomnia excessive daytime sleepiness.   I would like to thank Mickeal Skinner, MD and Mickeal Skinner, Md [address] [place],  [place] [address] for allowing me to meet with and to take care of this pleasant patient.   In short, Patrick Woodard is presenting with persistent Hypersomnia while he is sleeping much better on CPAP - and he is tolerating 13 cm water of CPAP well.  I plan to follow up either personally or through our NP within 3 months after his sleep study. I need to find out how old his current CPAP machine truly is.     Where should I keep my medication? Keep out of the reach of children and pets. This medication can be abused. Keep it in a safe place to protect it from theft. Do not share it with anyone. It is only for you. Selling or giving away this medication is dangerous and against the law. Store at room temperature between 20 and 25 degrees C (68 and 77 degrees F). Get rid of any unused medication after the expiration date. This medication may cause harm and death if it is taken by other adults, children, or pets. It is important to get rid of the medication as soon as you no longer need it or it is expired. You can do this in two ways: Take the medication to a medication take-back program. Check with your  pharmacy or law enforcement to find a location. If you cannot return the medication, check the label or package insert to see if the medication should be thrown out in the garbage or flushed down the toilet. If you are not sure, ask your care team. If it is safe to put it in the trash, take the medication out of the container. Mix the medication with cat litter, dirt, coffee grounds, or other unwanted substance. Seal the mixture in a bag or container. Put it in the trash. NOTE: This sheet is a summary. It may not cover all possible information. If you have questions about this medicine, talk to your doctor, pharmacist, or health care  provider.  2023 Elsevier/Gold Standard (2007-10-15 00:00:00)

## 2022-04-20 NOTE — Progress Notes (Addendum)
SLEEP MEDICINE CLINIC    Provider:  Melvyn Novas, MD  Primary Care Physician:  Patrick Skinner, MD [address] [place] [address]     Referring Provider: Mickeal Skinner, Md [address] [place],  [place] [address]          Chief Complaint according to patient   Patient presents with:     New Patient (Initial Visit)     Pt referred for sleep consult per occupational health MD ( [facility])  for CPAP management. Current CPAP not working properly and would like to discuss other options. Last PSG SPLIT  done in 2018 at [facility].  Pt states initially when starting her noticed great improvements now, he only notices a headache when not using. Reports feeling tired. Pt would like to try a different mask. DME [employer]. Pt pt would like to discuss inspire.      HISTORY OF PRESENT ILLNESS: SLEEP CLINIC  Seen here upon referral on 04/20/2022 from PCP/ occupational health MD for a new evaluation of [name] , [name] was originally seen by [facility] on 01-03-2017 , and prescribed CPAP with a FFM-  He was given a [employer] which was later recalled and replaced by a refurbished machine of unknown age in 2021-DME was [employer].  Sleep relevant medical/ surgical history or symptom history: Nocturia none , no Tonsillectomy: , no cervical spine injury, no deviated septum, some SOB.   Patrick Woodard is a 51 yo Caucasian male patient presenting with a past medical history of COPD (chronic obstructive pulmonary disease) (HCC), Diabetes mellitus (HCC), Dyspnea, Heart murmur, and Hypertension..   The patient had this SPLIT  sleep study at [facility] in 4 -2018 : Results: - Mild obstructive sleep apnea occurred during the diagnostic portion of the study (AHI = 11.5 /hour). An optimal PAP pressure was selected for this patient ( 13 cm of water) - Mild central sleep apnea occurred during the diagnostic portion of the  study (CAI = 6.2/hour). - The patient had minimal  oxygen desaturation during the diagnostic portion of the study (Min O2 = 85.00%) - No snoring was audible during . - EKG findings include PVCs. - No clinically significant periodic limb movements.   DIAGNOSIS - Obstructive Sleep Apnea (327.23 [G47.33 ICD-10])   RECOMMENDATIONS - Trial of CPAP therapy on 13 cm H2O with a Medium size [employer] Full Face Mask Simplus mask and heated humidification. - Avoid alcohol, sedatives and other CNS depressants that may worsen sleep apnea and disrupt normal sleep architecture. - Sleep hygiene should be reviewed to assess factors that may improve sleep quality. - Weight management and regular exercise should be initiated or continued.   [Electronically signed] 01/03/2017 11:26 AM  Patrick Duhamel MD, ABSM Diplomate, American Board of Sleep Medicine NPI: [id number]   Patrick Woodard Diplomate, American Board of Sleep Medicine   ELECTRONICALLY SIGNED ON:  01/03/2017, 11:25 AM [facility] PH: [phone]   FX: [phone] ACCREDITED BY THE AMERICAN ACADEMY OF SLEEP MEDICINE      Family medical /sleep history: Father with OSA.  Diagnoses   Aortic dissection, thoracic (HCC)  , affecting patient, his father and 2 paternal aunts - both died due to A dissection.      Social history:  Patient is working at [employer], mostly day time.   and lives in a household  with spouse. Tobacco use: none.  ETOH use/ 3 drinks a year, seldomly Caffeine intake in form of Coffee( rarely ).NO enegy drinks, tea or soda Regular exercise none .   Hobbies : golfing.    Sleep habits are as follows: The patient's dinner time is between 6.30-7 PM. The patient goes to bed at 9.30 PM and continues to sleep for 6-7 hours, wakes for none or one bathroom break- and reportedly sleeps poorly without CPAP.  The preferred sleep position is sideways , with the support of 1 CPAP  pillows. Dreams are reportedly rare.  4.15   AM is the usual rise time. The patient wakes up with an alarm. Early shift  He reports not feeling refreshed or restored in AM, with symptoms such as dry mouth on CPAP but no longer morning headaches, and residual fatigue.  Naps are taken infrequently.  Review of Systems: Out of a complete 14 system review, the patient complains of only the following symptoms, and all other reviewed systems are negative.:  Fatigue, sleepiness , snoring without CPAP.   Epworth sleepiness score:    Total = 16/ 24 points   FSS endorsed at 35/ 63 points.   Social History   Socioeconomic History   Marital status: Married    Spouse name: Patrick Woodard   Number of children: 2   Years of education: Not on file   Highest education level: Some college, no degree  Occupational History   Not on file  Tobacco Use   Smoking status: Former    Types: Cigarettes    Quit date: 1992    Years since quitting: 31.6   Smokeless tobacco: Never   Tobacco comments:    smoked a little as a teenager  Building services engineer Use: Never used  Substance and Sexual Activity   Alcohol use: Not Currently   Drug use: Not Currently   Sexual activity: Not on file  Other Topics Concern   Not on file  Social History Narrative   Lives at home with wife    L handed   Caffeine: rare   Social Determinants of Health   Financial Resource Strain: Not on file  Food Insecurity: Not on file  Transportation Needs: Not on file  Physical Activity: Not on file  Stress: Not on file  Social Connections: Not on file    Family History  Problem Relation Age of Onset   High blood pressure Mother    Heart Problems Mother    Aortic dissection Father        x2   Heart Problems Paternal Aunt     Past Medical History:  Diagnosis Date   COPD (chronic obstructive pulmonary disease) (HCC)    Diabetes mellitus without complication (HCC)    patient denies   Dyspnea    with activity -    Heart murmur     Hypertension     Past Surgical History:  Procedure Laterality Date   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     atorvastatin (LIPITOR) 10 MG tablet Take  10 mg by mouth daily.     loratadine (CLARITIN) 10 MG tablet Take 10 mg by mouth daily.     metoprolol tartrate (LOPRESSOR) 25 MG tablet Take 25 mg by mouth 2 (two) times daily.     metoprolol tartrate (LOPRESSOR) 25 MG tablet Take 1.5 tablets by mouth 2 (two) times daily.     montelukast (SINGULAIR) 10 MG tablet Take 10 mg by mouth daily.     sotalol (BETAPACE) 80 MG tablet Take 80 mg by mouth 2 (two) times daily.     No current facility-administered medications on file prior to visit.    Allergies  Allergen Reactions   Latex Dermatitis and Rash    Physical exam:  Today's Vitals   04/20/22 1509  BP: (!) 146/75  Pulse: 68  Weight: 219 lb (99.3 kg)  Height: 5\' 11"  (1.803 m)   Body mass index is 30.54 kg/m.   Wt Readings from Last 3 Encounters:  04/20/22 219 lb (99.3 kg)  05/28/21 214 lb (97.1 kg)  12/10/20 223 lb (101.2 kg)     Ht Readings from Last 3 Encounters:  04/20/22 5\' 11"  (1.803 m)  05/28/21 5\' 11"  (1.803 m)  12/10/20 6' (1.829 m)      General: The patient is awake, alert and appears not in acute distress. The patient is well groomed. Head: Normocephalic, atraumatic. Neck is supple.  Mallampati 1,  neck circumference:17 inches . Nasal airflow slightly impaired-  but patent.  Retrognathia is not seen.  Dental status: biological teeth.  Cardiovascular:  Regular rate rhythm by pulse,  no distended neck veins. Respiratory:  Lungs are clear to auscultation.  Skin:  Without evidence of ankle edema, or rash. Trunk: The patient's posture is erect.   Neurologic exam : The patient is awake and alert, oriented to place and time.   Memory subjective described as intact.  Attention span & concentration ability appears normal.  Speech is fluent,  without dysarthria, dysphonia or aphasia.  Mood and affect are appropriate.   Cranial nerves: no loss of smell or taste reported  Pupils are equal and briskly reactive to light. Funduscopic exam deferred.   Extraocular movements in vertical and horizontal planes were intact and without nystagmus. No Diplopia. Visual fields by finger perimetry are intact. Hearing was intact to soft voice and finger rubbing.   Facial sensation intact to fine touch.Facial motor strength is symmetric and tongue and uvula move midline.  Neck ROM : rotation, tilt and flexion extension were normal for age and shoulder shrug was symmetrical.    Motor exam:  Symmetric bulk, tone and ROM.   Normal tone without cog wheeling, symmetric grip strength .   Sensory:  Fine touch and vibration were tested  and  normal.  Proprioception tested in the upper extremities was normal.   Coordination: Rapid alternating movements in the fingers/hands were of normal speed.  The Finger-to-nose maneuver was intact without evidence of ataxia, dysmetria or tremor.   Gait and station: Patient could rise unassisted from a seated position, walked without assistive device.  Deep tendon reflexes: in the  upper and lower extremities are symmetric and intact.  Babinski response was deferred.    Summary: I have the pleasure of meeting Mr. Patrick Woodard today, who is new to our sleep clinic.  In April 2018 he was evaluated by a split-night polysomnography at [facility] long which was interpreted by Dr. Era Woodard.  He had mild sleep apnea but the AHI for the split  was set low and therefore he was titrated the same night.  After  he left the sleep lab he received a [employer] auto titration device which was set at 13 cm water.  He has continued to use his machine with good compliance of about 77% average user time is 4-1/2 hours at night but he does not feel that it has the same restorative and refreshing quality reduced to half.  His average residual AHI or apnea hypopnea index per hour of sleep is 1.7 which is a good resolution he is using an ramp of 20 minutes, starting at 4 cmH2O heated tubing.  Humidification is at level 3.  The patient endorsed a very high degree of residual sleepiness today 16 out of 24 points and he clearly noted that his CPAP is helping him to sleep better however it used to have an even more significant effect.   Residual Epworth sleepiness score of 16 is considered excessively daytime sleepy (EDS) and I think that this patient needs to be evaluated for the degree of apnea again.   I wonder if he has hypoxemia or central apneas arising.   We also need to get a baseline in case that he qualifies for an inspire device as he is as he is interested in.  His original sleep study mentions no significant snoring which is suspicious to me and I do not think that for patient with a residual AHI of 1.7/h there should any persistent hypersomnia related to apnea be present.   So the question is also all the other causes of this degree of sleepiness.  I will order the baseline sleep study and new machine would be needed anyway should he decide to decide to continue with CPAP.    I have to explain to the patient that in case of : central apneas, significant oxygen desaturations or REM sleep dependent apnea an inspire device would not be used. The same is true for a dental device.       After spending a total time of  35  minutes face to face and additional time for physical and neurologic examination, review of laboratory studies,  personal review of imaging studies, reports and results of other testing and  review of referral information / records as far as provided in visit, I have established the following assessments:  1)  mild OSA was diagnosed at outside facility and treated by outside provider- patient is new to [facility] . In the past sufficiently treated with CPAP 13 cm water, his self reported loud snoring was reduced ( wife confirmed the presence of snoring by phone)  2) sleep quality was much improved on CPAP.  3) persistent Hypersomnia.  " I will fall asleep watching Tv every night.  4) AAA !    My Plan is to proceed with:  1) new baseline sleep study.  2) Rv in 2-3 months to discuss treatment. 3) Modafinil ordered for Hypersomnia excessive daytime sleepiness.   I would like to thank Patrick Skinner, MD and Patrick Skinner, Md [address] [place],  [place] [address] for allowing me to meet with and to take care of this pleasant patient.   In short, Patrick Woodard is presenting with persistent Hypersomnia while he is sleeping much better on CPAP - and he is tolerating CPAP well.  I plan to follow up either personally or through our NP within 3 months after his sleep study for Sleep Medicine only. I need to find  out how old his current CPAP machine truly is.  DME: [employer] Set up on:11/20/2020 [facility]    Electronically signed by: Melvyn Novasarmen Leilany Digeronimo, MD 04/20/2022 3:24 PM  [facility] Board certified by The [employer] Board of Sleep Medicine and Diplomate of the [employer] of [employer] Medicine. Board certified In Neurology through the ABPN, Fellow of the [employer] of Neu[employer]. Medical Director of [facility].

## 2022-04-20 NOTE — Addendum Note (Signed)
Addended by: Melvyn Novas on: 04/20/2022 04:01 PM   Modules accepted: Orders

## 2022-04-21 ENCOUNTER — Telehealth: Payer: Self-pay

## 2022-04-21 LAB — CBC WITH DIFFERENTIAL/PLATELET
Basophils Absolute: 0 10*3/uL (ref 0.0–0.2)
Basos: 0 %
EOS (ABSOLUTE): 0.1 10*3/uL (ref 0.0–0.4)
Eos: 2 %
Hematocrit: 41.2 % (ref 37.5–51.0)
Hemoglobin: 14.2 g/dL (ref 13.0–17.7)
Immature Grans (Abs): 0 10*3/uL (ref 0.0–0.1)
Immature Granulocytes: 0 %
Lymphocytes Absolute: 1.1 10*3/uL (ref 0.7–3.1)
Lymphs: 19 %
MCH: 32 pg (ref 26.6–33.0)
MCHC: 34.5 g/dL (ref 31.5–35.7)
MCV: 93 fL (ref 79–97)
Monocytes Absolute: 0.7 10*3/uL (ref 0.1–0.9)
Monocytes: 11 %
Neutrophils Absolute: 4 10*3/uL (ref 1.4–7.0)
Neutrophils: 68 %
Platelets: 164 10*3/uL (ref 150–450)
RBC: 4.44 x10E6/uL (ref 4.14–5.80)
RDW: 13.1 % (ref 11.6–15.4)
WBC: 5.9 10*3/uL (ref 3.4–10.8)

## 2022-04-21 LAB — COMPREHENSIVE METABOLIC PANEL
ALT: 19 IU/L (ref 0–44)
AST: 16 IU/L (ref 0–40)
Albumin/Globulin Ratio: 1.9 (ref 1.2–2.2)
Albumin: 4.2 g/dL (ref 4.1–5.1)
Alkaline Phosphatase: 135 IU/L — ABNORMAL HIGH (ref 44–121)
BUN/Creatinine Ratio: 15 (ref 9–20)
BUN: 15 mg/dL (ref 6–24)
Bilirubin Total: 0.6 mg/dL (ref 0.0–1.2)
CO2: 24 mmol/L (ref 20–29)
Calcium: 9.1 mg/dL (ref 8.7–10.2)
Chloride: 104 mmol/L (ref 96–106)
Creatinine, Ser: 1.03 mg/dL (ref 0.76–1.27)
Globulin, Total: 2.2 g/dL (ref 1.5–4.5)
Glucose: 175 mg/dL — ABNORMAL HIGH (ref 70–99)
Potassium: 4.4 mmol/L (ref 3.5–5.2)
Sodium: 140 mmol/L (ref 134–144)
Total Protein: 6.4 g/dL (ref 6.0–8.5)
eGFR: 88 mL/min/{1.73_m2} (ref >59)

## 2022-04-21 LAB — THYROID PANEL WITH TSH
Free Thyroxine Index: 2.2 (ref 1.2–4.9)
T3 Uptake Ratio: 31 % (ref 24–39)
T4, Total: 7.2 ug/dL (ref 4.5–12.0)
TSH: 0.799 u[IU]/mL (ref 0.450–4.500)

## 2022-04-21 NOTE — Telephone Encounter (Signed)
PA for Armodafinil 150 mg has been sent via cmm  (Key: [id number])  This request has been approved.  Please note any additional information provided by [employer] at the bottom of your screen. ? Your request has been approved CaseId:[id number];Status:Approved;Review Type:Prior Auth;Coverage Start Date:03/22/2022;Coverage End Date:04/21/2023;

## 2022-04-22 ENCOUNTER — Telehealth: Payer: Self-pay | Admitting: Neurology

## 2022-04-22 NOTE — Telephone Encounter (Signed)
Called the patient and advised of the lab results. Advised that PCP should continue to monitor glucose and phosphatase levels yearly. I have routed these results to the patient's PCP for him. Pt verbalized understanding. Pt had no questions at this time but was encouraged to call back if questions arise.

## 2022-04-22 NOTE — Telephone Encounter (Signed)
-----  Message from Larey Seat, MD sent at 04/21/2022  6:14 PM EDT ----- Non fast sample , therefor glucose is meaningless. Alk phosphatase is elevated, should be followed yearly.

## 2022-05-23 ENCOUNTER — Other Ambulatory Visit: Payer: Self-pay

## 2022-05-23 ENCOUNTER — Encounter (HOSPITAL_COMMUNITY): Payer: Self-pay | Admitting: *Deleted

## 2022-05-23 ENCOUNTER — Emergency Department (HOSPITAL_COMMUNITY): Payer: BC Managed Care – PPO

## 2022-05-23 ENCOUNTER — Inpatient Hospital Stay (HOSPITAL_COMMUNITY)
Admission: EM | Admit: 2022-05-23 | Discharge: 2022-05-25 | DRG: 064 | Disposition: A | Discharge status: Home / Self Care | Payer: BC Managed Care – PPO | Attending: Family Medicine | Admitting: Family Medicine

## 2022-05-23 ENCOUNTER — Inpatient Hospital Stay (HOSPITAL_COMMUNITY): Payer: BC Managed Care – PPO

## 2022-05-23 DIAGNOSIS — F40240 Claustrophobia: Secondary | ICD-10-CM | POA: Diagnosis present

## 2022-05-23 DIAGNOSIS — J449 Chronic obstructive pulmonary disease, unspecified: Secondary | ICD-10-CM | POA: Diagnosis present

## 2022-05-23 DIAGNOSIS — Z951 Presence of aortocoronary bypass graft: Secondary | ICD-10-CM | POA: Diagnosis not present

## 2022-05-23 DIAGNOSIS — Z20822 Contact with and (suspected) exposure to covid-19: Secondary | ICD-10-CM | POA: Diagnosis present

## 2022-05-23 DIAGNOSIS — Z87891 Personal history of nicotine dependence: Secondary | ICD-10-CM | POA: Diagnosis not present

## 2022-05-23 DIAGNOSIS — I7100 Dissection of unspecified site of aorta: Secondary | ICD-10-CM | POA: Diagnosis not present

## 2022-05-23 DIAGNOSIS — I480 Paroxysmal atrial fibrillation: Secondary | ICD-10-CM | POA: Diagnosis present

## 2022-05-23 DIAGNOSIS — I10 Essential (primary) hypertension: Secondary | ICD-10-CM | POA: Diagnosis present

## 2022-05-23 DIAGNOSIS — E785 Hyperlipidemia, unspecified: Secondary | ICD-10-CM | POA: Diagnosis present

## 2022-05-23 DIAGNOSIS — R29705 NIHSS score 5: Secondary | ICD-10-CM | POA: Diagnosis present

## 2022-05-23 DIAGNOSIS — I63412 Cerebral infarction due to embolism of left middle cerebral artery: Principal | ICD-10-CM | POA: Diagnosis present

## 2022-05-23 DIAGNOSIS — R079 Chest pain, unspecified: Secondary | ICD-10-CM

## 2022-05-23 DIAGNOSIS — R1111 Vomiting without nausea: Secondary | ICD-10-CM | POA: Diagnosis not present

## 2022-05-23 DIAGNOSIS — E854 Organ-limited amyloidosis: Secondary | ICD-10-CM | POA: Diagnosis present

## 2022-05-23 DIAGNOSIS — J9601 Acute respiratory failure with hypoxia: Secondary | ICD-10-CM | POA: Diagnosis present

## 2022-05-23 DIAGNOSIS — I680 Cerebral amyloid angiopathy: Secondary | ICD-10-CM | POA: Diagnosis present

## 2022-05-23 DIAGNOSIS — Z7982 Long term (current) use of aspirin: Secondary | ICD-10-CM

## 2022-05-23 DIAGNOSIS — R7989 Other specified abnormal findings of blood chemistry: Secondary | ICD-10-CM | POA: Diagnosis not present

## 2022-05-23 DIAGNOSIS — R1110 Vomiting, unspecified: Secondary | ICD-10-CM | POA: Diagnosis present

## 2022-05-23 DIAGNOSIS — E119 Type 2 diabetes mellitus without complications: Secondary | ICD-10-CM | POA: Diagnosis present

## 2022-05-23 DIAGNOSIS — I6389 Other cerebral infarction: Secondary | ICD-10-CM | POA: Diagnosis not present

## 2022-05-23 DIAGNOSIS — Z79899 Other long term (current) drug therapy: Secondary | ICD-10-CM

## 2022-05-23 DIAGNOSIS — R414 Neurologic neglect syndrome: Secondary | ICD-10-CM | POA: Diagnosis present

## 2022-05-23 DIAGNOSIS — Z91040 Latex allergy status: Secondary | ICD-10-CM

## 2022-05-23 DIAGNOSIS — G4733 Obstructive sleep apnea (adult) (pediatric): Secondary | ICD-10-CM | POA: Diagnosis present

## 2022-05-23 DIAGNOSIS — I639 Cerebral infarction, unspecified: Secondary | ICD-10-CM | POA: Diagnosis present

## 2022-05-23 DIAGNOSIS — E1165 Type 2 diabetes mellitus with hyperglycemia: Secondary | ICD-10-CM | POA: Diagnosis present

## 2022-05-23 DIAGNOSIS — G8191 Hemiplegia, unspecified affecting right dominant side: Secondary | ICD-10-CM | POA: Diagnosis present

## 2022-05-23 LAB — CBC
HCT: 40.3 % (ref 39.0–52.0)
Hemoglobin: 14.6 g/dL (ref 13.0–17.0)
MCH: 33.3 pg (ref 26.0–34.0)
MCHC: 36.2 g/dL — ABNORMAL HIGH (ref 30.0–36.0)
MCV: 91.8 fL (ref 80.0–100.0)
Platelets: 178 10*3/uL (ref 150–400)
RBC: 4.39 MIL/uL (ref 4.22–5.81)
RDW: 12.5 % (ref 11.5–15.5)
WBC: 9.3 10*3/uL (ref 4.0–10.5)
nRBC: 0 % (ref 0.0–0.2)

## 2022-05-23 LAB — COMPREHENSIVE METABOLIC PANEL
ALT: 22 U/L (ref 0–44)
AST: 22 U/L (ref 15–41)
Albumin: 3.9 g/dL (ref 3.5–5.0)
Alkaline Phosphatase: 109 U/L (ref 38–126)
Anion gap: 10 (ref 5–15)
BUN: 16 mg/dL (ref 6–20)
CO2: 24 mmol/L (ref 22–32)
Calcium: 8.9 mg/dL (ref 8.9–10.3)
Chloride: 102 mmol/L (ref 98–111)
Creatinine, Ser: 1.04 mg/dL (ref 0.61–1.24)
GFR, Estimated: >60 mL/min (ref >60)
Glucose, Bld: 231 mg/dL — ABNORMAL HIGH (ref 70–99)
Potassium: 4 mmol/L (ref 3.5–5.1)
Sodium: 136 mmol/L (ref 135–145)
Total Bilirubin: 0.7 mg/dL (ref 0.3–1.2)
Total Protein: 6.8 g/dL (ref 6.5–8.1)

## 2022-05-23 LAB — I-STAT CHEM 8, ED
BUN: 17 mg/dL (ref 6–20)
Calcium, Ion: 1.12 mmol/L — ABNORMAL LOW (ref 1.15–1.40)
Chloride: 101 mmol/L (ref 98–111)
Creatinine, Ser: 0.9 mg/dL (ref 0.61–1.24)
Glucose, Bld: 229 mg/dL — ABNORMAL HIGH (ref 70–99)
HCT: 42 % (ref 39.0–52.0)
Hemoglobin: 14.3 g/dL (ref 13.0–17.0)
Potassium: 4.1 mmol/L (ref 3.5–5.1)
Sodium: 139 mmol/L (ref 135–145)
TCO2: 27 mmol/L (ref 22–32)

## 2022-05-23 LAB — DIFFERENTIAL
Abs Immature Granulocytes: 0.03 10*3/uL (ref 0.00–0.07)
Basophils Absolute: 0.1 10*3/uL (ref 0.0–0.1)
Basophils Relative: 1 %
Eosinophils Absolute: 0.2 10*3/uL (ref 0.0–0.5)
Eosinophils Relative: 2 %
Immature Granulocytes: 0 %
Lymphocytes Relative: 25 %
Lymphs Abs: 2.3 10*3/uL (ref 0.7–4.0)
Monocytes Absolute: 0.8 10*3/uL (ref 0.1–1.0)
Monocytes Relative: 9 %
Neutro Abs: 5.8 10*3/uL (ref 1.7–7.7)
Neutrophils Relative %: 63 %

## 2022-05-23 LAB — TROPONIN I (HIGH SENSITIVITY): Troponin I (High Sensitivity): 3 ng/L (ref <18)

## 2022-05-23 LAB — D-DIMER, QUANTITATIVE: D-Dimer, Quant: 3.55 ug/mL-FEU — ABNORMAL HIGH (ref 0.00–0.50)

## 2022-05-23 LAB — PROTIME-INR
INR: 1.1 (ref 0.8–1.2)
Prothrombin Time: 13.7 seconds (ref 11.4–15.2)

## 2022-05-23 LAB — CBG MONITORING, ED: Glucose-Capillary: 176 mg/dL — ABNORMAL HIGH (ref 70–99)

## 2022-05-23 LAB — APTT: aPTT: 26 seconds (ref 24–36)

## 2022-05-23 LAB — ETHANOL: Alcohol, Ethyl (B): <10 mg/dL (ref <10)

## 2022-05-23 MED ORDER — LORAZEPAM 2 MG/ML IJ SOLN: 1.0000 mg | Freq: Once | INTRAMUSCULAR | Status: DC

## 2022-05-23 MED ORDER — CLOPIDOGREL BISULFATE 75 MG PO TABS
75.0000 mg | ORAL_TABLET | Freq: Every day | ORAL | Status: DC
  Administered 2022-05-24 – 2022-05-25 (×2): 75 mg via ORAL
  Filled 2022-05-23 (×2): qty 1

## 2022-05-23 MED ORDER — SODIUM CHLORIDE 0.9 % IV SOLN: INTRAVENOUS | Status: DC

## 2022-05-23 MED ORDER — LORAZEPAM 2 MG/ML IJ SOLN
INTRAMUSCULAR | Status: AC
  Administered 2022-05-23: 2 mg
  Filled 2022-05-23: qty 1

## 2022-05-23 MED ORDER — SODIUM CHLORIDE 0.9 % IV BOLUS
1000.0000 mL | Freq: Once | INTRAVENOUS | Status: AC
  Administered 2022-05-24: 1000 mL via INTRAVENOUS

## 2022-05-23 MED ORDER — ASPIRIN 300 MG RE SUPP: 300.0000 mg | Freq: Every day | RECTAL | Status: DC

## 2022-05-23 MED ORDER — IOHEXOL 350 MG/ML SOLN
80.0000 mL | Freq: Once | INTRAVENOUS | Status: AC | PRN
  Administered 2022-05-23: 80 mL via INTRAVENOUS

## 2022-05-23 MED ORDER — SODIUM CHLORIDE 0.9% FLUSH
3.0000 mL | Freq: Once | INTRAVENOUS | Status: AC
  Administered 2022-05-23: 3 mL via INTRAVENOUS

## 2022-05-23 MED ORDER — IOHEXOL 350 MG/ML SOLN
75.0000 mL | Freq: Once | INTRAVENOUS | Status: AC | PRN
  Administered 2022-05-23: 75 mL via INTRAVENOUS

## 2022-05-23 MED ORDER — ASPIRIN 81 MG PO CHEW
81.0000 mg | CHEWABLE_TABLET | Freq: Every day | ORAL | Status: DC
  Administered 2022-05-24 – 2022-05-25 (×3): 81 mg via ORAL
  Filled 2022-05-23 (×3): qty 1

## 2022-05-23 MED ORDER — ONDANSETRON HCL 4 MG/2ML IJ SOLN
4.0000 mg | Freq: Once | INTRAMUSCULAR | Status: AC
  Administered 2022-05-23: 4 mg via INTRAVENOUS
  Filled 2022-05-23: qty 2

## 2022-05-23 MED ORDER — ONDANSETRON HCL 4 MG/2ML IJ SOLN
4.0000 mg | Freq: Once | INTRAMUSCULAR | Status: AC
  Administered 2022-05-23: 4 mg via INTRAVENOUS

## 2022-05-23 MED ORDER — LORAZEPAM 2 MG/ML IJ SOLN
INTRAMUSCULAR | Status: AC
  Administered 2022-05-23: 1 mg
  Filled 2022-05-23: qty 1

## 2022-05-23 NOTE — ED Notes (Signed)
Pt taken from CT to MRI.

## 2022-05-23 NOTE — Consult Note (Signed)
NEUROLOGY CONSULTATION NOTE   Date of service: May 23, 2022 Patient Name: Jadd Lindemann MRN:  [id number] DOB:  April 11, 1971 Reason for consult: "Stroke code for R sided weakness and R hemianopsia and neglect" Requesting Provider: No att. providers found _ _ _   _ __   _ __ _ _  __ __   _ __   __ _  History of Present Illness  Midas Feimster is a 51 y.o. male with PMH significant for aortic dissection, COPD, DM2, HTN, ?hx of cerebral amyloid angiopathy vs hypertension, hx of aortic dissection s/p repair in 2014 who was driving back from restaurant when he reports sudden onset chest pain. EMS called and he was noted to have profound R sided weakness along with R hemianopsia and extinction. A code stroke was activated. Weakness completely resolved in 10 mins spontaneously. His extinction had improved in the ED but he continued to have persistent R hemianopsia.  STAT CTH was negative. Given his complicated history with concern for cerebral amyloid angiopathy and hx of dissection and reported chest pain today, CT angio head and neck and CT dissection study was obtained with stable dissection and no LVO.  STAT MRI Brain was obtained which shows watershed infarct in the MCA/PCA/ACA distribution.  LKW: 2000 mRS: 0 tNKASE: not offered, given more than 10 microhemorrhages on SWI imaging with reported history of cerebral amyloid angiopathy. Thrombectomy: not offered, no LVO NIHSS components Score: Comment  1a Level of Conscious 0[x]  1[]  2[]  3[]      1b LOC Questions 0[x]  1[]  2[]       1c LOC Commands 0[x]  1[]  2[]       2 Best Gaze 0[]  1[x]  2[]       3 Visual 0[]  1[]  2[x]  3[]      4 Facial Palsy 0[x]  1[]  2[]  3[]      5a Motor Arm - left 0[x]  1[]  2[]  3[]  4[]  UN[]    5b Motor Arm - Right 0[x]  1[]  2[]  3[]  4[]  UN[]    6a Motor Leg - Left 0[x]  1[]  2[]  3[]  4[]  UN[]    6b Motor Leg - Right 0[x]  1[]  2[]  3[]  4[]  UN[]    7 Limb Ataxia 0[x]  1[]  2[]  3[]  UN[]     8 Sensory 0[]  1[x]  2[]  UN[]      9 Best Language 0[x]  1[]  2[]   3[]      10 Dysarthria 0[x]  1[]  2[]  UN[]      11 Extinct. and Inattention 0[]  1[x]  2[]       TOTAL: 5    Delay to tnkase decision making: there was a significant delay to making decision regarding tnkase. Given complex history, decision made to get MRI. He was given ativan 1mg  for claustrophobis but had vomiting. Was given additional 2mg  of Ativan, followed by 4mg  of Zofran and had 2 episodes of vomiting in the scanner and had to be pulled out of MRI. Given had to be emergently paused x 2, had to be restarted that took about 20 mins. Tnkase was offered pending MRI Brain and wife and patient leaning towards consenting for tnkase. However, eventually, we were able to get the SWI images for detailed evaluation of concern for CAA. While imaging not confirmatory, given more than 10 microhemorrhages and lobar hemorrhage, he was deemed high risk for hemorrhage and thus tnkase not offered.   ROS   Constitutional Denies weight loss, fever and chills.   HEENT Denies changes in vision and hearing.   Respiratory Denies SOB and cough.   CV Denies palpitations and CP   GI Denies abdominal  pain, nausea, vomiting and diarrhea.   GU Denies dysuria and urinary frequency.   MSK Denies myalgia and joint pain.   Skin Denies rash and pruritus.   Neurological Denies headache and syncope.   Psychiatric Denies recent changes in mood. Denies anxiety and depression.    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   HAND SURGERY Left    injury   UMBILICAL HERNIA REPAIR N/A 12/10/2020   Procedure: LAPAROSCOPIC UMBILICAL HERNIA REPAIR WITH MESH;  Surgeon: Diamantina MonksLovick, Ayesha N, MD;   Location: [facility] OR;  Service: General;  Laterality: N/A;   Family History  Problem Relation Age of Onset   High blood pressure Mother    Heart Problems Mother    Aortic dissection Father        x2   Heart Problems Paternal Aunt    Social History   Socioeconomic History   Marital status: Married    Spouse name: Zella BallRobin   Number of children: 2   Years of education: Not on file   Highest education level: Some college, no degree  Occupational History   Not on file  Tobacco Use   Smoking status: Former    Types: Cigarettes    Quit date: 1992    Years since quitting: 31.7   Smokeless tobacco: Never   Tobacco comments:    smoked a little as a teenager  Building services engineerVaping Use   Vaping Use: Never used  Substance and Sexual Activity   Alcohol use: Not Currently   Drug use: Not Currently   Sexual activity: Not on file  Other Topics Concern   Not on file  Social History Narrative   Lives at home with wife    L handed   Caffeine: rare   Social Determinants of Health   Financial Resource Strain: Not on file  Food Insecurity: Not on file  Transportation Needs: Not on file  Physical Activity: Not on file  Stress: Not on file  Social Connections: Not on file   Allergies  Allergen Reactions   Latex Dermatitis and Rash    Medications  (Not in a hospital admission)    Vitals   Vitals:   05/23/22 2000  Weight: 99.6 kg     Body mass index is 30.62 kg/m.  Physical Exam   General: Laying comfortably in bed; in no acute distress.  HENT: Normal oropharynx and mucosa. Normal external appearance of ears and nose.  Neck: Supple, no pain or tenderness  CV: No JVD. No peripheral edema.  Pulmonary: Symmetric Chest rise. Normal respiratory effort.  Abdomen: Soft to touch, non-tender.  Ext: No cyanosis, edema, or deformity  Skin: No rash. Normal palpation of skin.   Musculoskeletal: Normal digits and nails by inspection. No clubbing.   Neurologic Examination  Mental status/Cognition:  Alert, oriented to self, place, month and year, good attention.  Speech/language: Fluent, comprehension intact, object naming intact, repetition intact.  Cranial nerves:   CN II Pupils equal and reactive to light, R hemianopsia.   CN III,IV,VI EOM intact, no gaze preference or deviation, no nystagmus  CN V normal sensation in V1, V2, and V3 segments bilaterally    CN VII no asymmetry, no nasolabial fold flattening    CN VIII normal hearing to speech    CN IX & X normal palatal elevation, no uvular deviation    CN XI 5/5 head turn and 5/5 shoulder shrug bilaterally   CN XII midline tongue protrusion   Motor:  Muscle bulk: normal, tone normal, pronator drift none tremor none Mvmt Root Nerve  Muscle Right Left Comments  SA C5/6 Ax Deltoid 5 5   EF C5/6 Mc Biceps 5 5   EE C6/7/8 Rad Triceps 5 5   WF C6/7 Med FCR     WE C7/8 PIN ECU     F Ab C8/T1 U ADM/FDI 5 5   HF L1/2/3 Fem Illopsoas 5 5   KE L2/3/4 Fem Quad 5 5   DF L4/5 D Peron Tib Ant 5 5   PF S1/2 Tibial Grc/Sol 5 5    Sensation:  Light touch Extinction to light touch in RUE and RLE. No neglect.   Pin prick    Temperature    Vibration   Proprioception    Coordination/Complex Motor:  - Finger to Nose intact BL - Heel to shin intact BL - Rapid alternating movement are normal - Gait: Deferred for patient safety.  Labs   CBC: No results for input(s): "WBC", "NEUTROABS", "HGB", "HCT", "MCV", "PLT" in the last 168 hours.  Basic Metabolic Panel:  Lab Results  Component Value Date   NA 140 04/20/2022   K 4.4 04/20/2022   CO2 24 04/20/2022   GLUCOSE 175 (H) 04/20/2022   BUN 15 04/20/2022   CREATININE 1.03 04/20/2022   CALCIUM 9.1 04/20/2022   GFRNONAA >60 12/10/2020   Lipid Panel: No results found for: "LDLCALC" HgbA1c: No results found for: "HGBA1C" Urine Drug Screen: No results found for: "LABOPIA", "COCAINSCRNUR", "LABBENZ", "AMPHETMU", "THCU", "LABBARB"  Alcohol Level No results found for: "ETH"  CT Head  without contrast(Personally reviewed): CTH was negative for a large hypodensity concerning for a large territory infarct or hyperdensity concerning for an ICH  CT angio Head and Neck with contrast(Personally reviewed): No LVO. Dissection appears stable.  MRI Brain(Personally reviewed): Watershed L ACA/MCA/PCA territory infarct, has more than 10 microhemorrhages on SWI images on MRI.  Impression   Hananiah Sestito is a 51 y.o. male with PMH significant for aortic dissection, COPD, DM2, HTN, ?hx of cerebral amyloid angiopathy vs hypertension, hx of aortic dissection s/p repair in 2014 who was driving back from restaurant when he reports sudden onset chest pain. EMS called and he was noted to have profound R sided weakness along with R hemianopsia and extinction. CTH negative for ICH, no LVO on CT angio head and neck. Tnkase was considered and STAT MRI Was obtained which demonstrated watershed infarct in L ACA/MCA/PCA distribution without any LVO. MRI also shows more than 10 microhemorrhages on SWI images and thus tnkase was not offered.  Will do permissive hypertension, head of bed flat, IV fluids and strict bed rest for 48 hours.  Primary Diagnosis:  Cerebral infarction due to embolism of  left middle cerebral artery.   Secondary Diagnosis: Essential (primary) hypertension and Paroxysmal atrial fibrillation  Recommendations   - Frequent Neuro checks per stroke unit protocol - Recommend obtaining TTE - Recommend obtaining Lipid panel with LDL - Please start statin if LDL > 70 - Recommend HbA1c - Antithrombotic - Aspirin 81mg  daily along with plavix 75mg  daily x  21days, followed by Aspirin 81mg  daily alone. - Recommend DVT ppx - SBP goal - permissive hypertension first 24 h < 220/110. Held home meds.  - Recommend Telemetry monitoring for arrythmia - Recommend bedside swallow screen prior to PO intake. - Stroke education booklet - Recommend PT/OT/SLP consult - recommend head of bed flat,  strict bed rest for 48 hours and IV fluids at 129ml/hr x 48 hours.   This patient is critically ill and at significant risk of neurological worsening, death and care requires constant monitoring of vital signs, hemodynamics,respiratory and cardiac monitoring, neurological assessment, discussion with family, other specialists and medical decision making of high complexity. I spent 100 minutes of neurocritical care time  in the care of  this patient. This was time spent independent of any time provided by nurse practitioner or PA.  Donnetta Simpers [facility] Pager Number [phone] 05/23/2022  11:27 PM   ______________________________________________________________________   Thank you for the opportunity to take part in the care of this patient. If you have any further questions, please contact the neurology consultation attending.  Signed,  [place] Pager Number [phone] _ _ _   _ __   _ __ _ _  __ __   _ __   __ _

## 2022-05-23 NOTE — ED Notes (Signed)
Family in consult room 

## 2022-05-23 NOTE — ED Notes (Signed)
Family at bedside. Patient alert and oriented, a little sleepy from the Ativan

## 2022-05-23 NOTE — ED Notes (Signed)
Lab to add on troponin, d-dimer

## 2022-05-23 NOTE — ED Provider Notes (Signed)
[facility] EMERGENCY DEPARTMENT Provider Note   CSN: [id number] Arrival date & time: 05/23/22  2027     History {Add pertinent medical, surgical, social history, OB history to HPI:1} No chief complaint on file.   Patrick Woodard is a 51 y.o. male.  Level 5 caveat for acuity of condition.  Patient arrives as code stroke.  He was [place] when he began to feel central chest pain that radiated to his right side.  EMS was called for chest pain.  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  Chest pain is now resolved.  On EMS arrival patient was noted to have right-sided neglect and visual changes in code stroke was activated.  His chest pain was not associate with any other risk factors.  No shortness of breath, nausea, cough, fever, diaphoresis or vomiting. On arrival patient has no chest pain.  He has a right-sided visual field cut and some neglect and neurology evaluation. He denies any headache.  Denies any abdominal pain, nausea vomiting, cough or fever. 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  The history is provided by the patient and the EMS personnel. The history is limited by the condition of the patient.       Home Medications Prior to Admission medications   Medication Sig Start Date End Date Taking? Authorizing Provider  Armodafinil 150 MG tablet Take 1 tablet (150 mg total) by mouth daily. 04/20/22   Dohmeier, Asencion Partridge, MD  aspirin 325 MG tablet Take 325 mg by mouth daily.    [provider]  atorvastatin (LIPITOR) 10 MG tablet Take 10 mg by mouth daily.    [provider]  loratadine (CLARITIN) 10 MG tablet Take 10 mg by mouth daily.    [provider]  metoprolol tartrate (LOPRESSOR) 25 MG tablet Take 25 mg by mouth 2 (two) times daily.    [provider]  metoprolol tartrate (LOPRESSOR) 25 MG tablet Take 1.5 tablets by mouth 2 (two) times daily. 01/12/22    [provider]  montelukast (SINGULAIR) 10 MG tablet Take 10 mg by mouth daily.    [provider]  sotalol (BETAPACE) 80 MG tablet Take 80 mg by mouth 2 (two) times daily.    [provider]      Allergies    Latex    Review of Systems   Review of Systems  Unable to perform ROS: Acuity of condition    Physical Exam Updated Vital Signs Wt 99.6 kg   BMI 30.62 kg/m  Physical Exam Vitals and nursing note reviewed.  Constitutional:      General: He is not in acute distress.    Appearance: He is well-developed.  HENT:     Head: Normocephalic and atraumatic.     Mouth/Throat:     Pharynx: No oropharyngeal exudate.  Eyes:     Conjunctiva/sclera: Conjunctivae normal.     Pupils: Pupils are equal, round, and reactive to light.  Neck:     Comments: No meningismus. Cardiovascular:     Rate and Rhythm: Normal rate and regular rhythm.     Heart sounds: Normal heart sounds. No murmur heard. Pulmonary:     Effort: Pulmonary effort is normal. No respiratory distress.     Breath sounds: Normal breath sounds.  Chest:     Chest wall: No tenderness.  Abdominal:     Palpations: Abdomen is soft.  Tenderness: There is no abdominal tenderness. There is no guarding or rebound.  Musculoskeletal:        General: No tenderness. Normal range of motion.     Cervical back: Normal range of motion and neck supple.  Skin:    General: Skin is warm.  Neurological:     Mental Status: He is alert and oriented to person, place, and time.     Cranial Nerves: No cranial nerve deficit.     Motor: No abnormal muscle tone.     Coordination: Coordination normal.     Comments: No facial droop, cranial nerves II to XII intact, 5/5 strength throughout, no pronator drift, no ataxia on finger-to-nose Right-sided hemianopsia  Psychiatric:        Behavior: Behavior normal.     ED Results / Procedures / Treatments   Labs (all labs ordered are listed, but only abnormal results  are displayed) Labs Reviewed  CBC - Abnormal; Notable for the following components:      Result Value   MCHC 36.2 (*)    All other components within normal limits  I-STAT CHEM 8, ED - Abnormal; Notable for the following components:   Glucose, Bld 229 (*)    Calcium, Ion 1.12 (*)    All other components within normal limits  CBG MONITORING, ED - Abnormal; Notable for the following components:   Glucose-Capillary 176 (*)    All other components within normal limits  PROTIME-INR  APTT  DIFFERENTIAL  COMPREHENSIVE METABOLIC PANEL  ETHANOL  D-DIMER, QUANTITATIVE  TROPONIN I (HIGH SENSITIVITY)    EKG None  Radiology CT HEAD CODE STROKE WO CONTRAST  Result Date: 05/23/2022 CLINICAL DATA:  Stroke-like symptoms EXAM: CT HEAD WITHOUT CONTRAST CT ANGIOGRAPHY OF THE HEAD AND NECK TECHNIQUE: Contiguous axial images were obtained from the base of the skull through the vertex without intravenous contrast. Multidetector CT imaging of the head and neck was performed using the standard protocol during bolus administration of intravenous contrast. Multiplanar CT image reconstructions and MIPs were obtained to evaluate the vascular anatomy. Carotid stenosis measurements (when applicable) are obtained utilizing NASCET criteria, using the distal internal carotid diameter as the denominator. RADIATION DOSE REDUCTION: This exam was performed according to the departmental dose-optimization program which includes automated exposure control, adjustment of the mA and/or kV according to patient size and/or use of iterative reconstruction technique. CONTRAST:  Ten 90 little burr COMPARISON:  None Available. FINDINGS: CT HEAD FINDINGS Brain: There is no mass, hemorrhage or extra-axial collection. The size and configuration of the ventricles and extra-axial CSF spaces are normal. The brain parenchyma is normal, without evidence of acute or chronic infarction. Vascular: No abnormal hyperdensity of the major intracranial  arteries or dural venous sinuses. No intracranial atherosclerosis. Skull: The visualized skull base, calvarium and extracranial soft tissues are normal. Sinuses/Orbits: No fluid levels or advanced mucosal thickening of the visualized paranasal sinuses. No mastoid or middle ear effusion. The orbits are normal. ASPECTS ([place] Stroke Program Early CT Score) - Ganglionic level infarction (caudate, lentiform nuclei, internal capsule, insula, M1-M3 cortex): 7 - Supraganglionic infarction (M4-M6 cortex): 3 Total score (0-10 with 10 being normal): 10 CTA NECK FINDINGS Aortic dissection is evaluated completely on concomitant CTA chest. RIGHT CAROTID SYSTEM: Skeleton: Normal. Other neck: Unremarkable. LEFT CAROTID SYSTEM: Normal without aneurysm, dissection or stenosis. VERTEBRAL ARTERIES: Left dominant configuration. Both origins are clearly patent. There is no dissection, occlusion or flow-limiting stenosis to the skull base (V1-V3 segments). CTA HEAD FINDINGS POSTERIOR CIRCULATION: --Vertebral arteries:  Normal V4 segments. --Inferior cerebellar arteries: Normal. --Basilar artery: Normal. --Superior cerebellar arteries: Normal. --Posterior cerebral arteries (PCA): Normal. ANTERIOR CIRCULATION: --Intracranial internal carotid arteries: Normal. --Anterior cerebral arteries (ACA): Normal. Both A1 segments are present. Patent anterior communicating artery (a-comm). --Middle cerebral arteries (MCA): Normal. VENOUS SINUSES: As permitted by contrast timing, patent. ANATOMIC VARIANTS: None Review of the MIP images confirms the above findings. IMPRESSION: 1. No intracranial hemorrhage or mass lesion. ASPECTS is 10. 2. No emergent large vessel occlusion or high-grade stenosis of the intracranial arteries. 3. Aortic dissection, evaluated completely on concomitant CTA chest. These results were called by telephone at the time of interpretation on 05/23/2022 at 8:57 pm to providers [facility] ; [facility] , who verbally  acknowledged these results. Electronically Signed   By: Ulyses Jarred M.D.   On: 05/23/2022 20:58   CT ANGIO HEAD NECK W WO CM (CODE STROKE)  Result Date: 05/23/2022 CLINICAL DATA:  Stroke-like symptoms EXAM: CT HEAD WITHOUT CONTRAST CT ANGIOGRAPHY OF THE HEAD AND NECK TECHNIQUE: Contiguous axial images were obtained from the base of the skull through the vertex without intravenous contrast. Multidetector CT imaging of the head and neck was performed using the standard protocol during bolus administration of intravenous contrast. Multiplanar CT image reconstructions and MIPs were obtained to evaluate the vascular anatomy. Carotid stenosis measurements (when applicable) are obtained utilizing NASCET criteria, using the distal internal carotid diameter as the denominator. RADIATION DOSE REDUCTION: This exam was performed according to the departmental dose-optimization program which includes automated exposure control, adjustment of the mA and/or kV according to patient size and/or use of iterative reconstruction technique. CONTRAST:  Ten 90 little burr COMPARISON:  None Available. FINDINGS: CT HEAD FINDINGS Brain: There is no mass, hemorrhage or extra-axial collection. The size and configuration of the ventricles and extra-axial CSF spaces are normal. The brain parenchyma is normal, without evidence of acute or chronic infarction. Vascular: No abnormal hyperdensity of the major intracranial arteries or dural venous sinuses. No intracranial atherosclerosis. Skull: The visualized skull base, calvarium and extracranial soft tissues are normal. Sinuses/Orbits: No fluid levels or advanced mucosal thickening of the visualized paranasal sinuses. No mastoid or middle ear effusion. The orbits are normal. ASPECTS ([place] Stroke Program Early CT Score) - Ganglionic level infarction (caudate, lentiform nuclei, internal capsule, insula, M1-M3 cortex): 7 - Supraganglionic infarction (M4-M6 cortex): 3 Total score (0-10 with 10  being normal): 10 CTA NECK FINDINGS Aortic dissection is evaluated completely on concomitant CTA chest. RIGHT CAROTID SYSTEM: Skeleton: Normal. Other neck: Unremarkable. LEFT CAROTID SYSTEM: Normal without aneurysm, dissection or stenosis. VERTEBRAL ARTERIES: Left dominant configuration. Both origins are clearly patent. There is no dissection, occlusion or flow-limiting stenosis to the skull base (V1-V3 segments). CTA HEAD FINDINGS POSTERIOR CIRCULATION: --Vertebral arteries: Normal V4 segments. --Inferior cerebellar arteries: Normal. --Basilar artery: Normal. --Superior cerebellar arteries: Normal. --Posterior cerebral arteries (PCA): Normal. ANTERIOR CIRCULATION: --Intracranial internal carotid arteries: Normal. --Anterior cerebral arteries (ACA): Normal. Both A1 segments are present. Patent anterior communicating artery (a-comm). --Middle cerebral arteries (MCA): Normal. VENOUS SINUSES: As permitted by contrast timing, patent. ANATOMIC VARIANTS: None Review of the MIP images confirms the above findings. IMPRESSION: 1. No intracranial hemorrhage or mass lesion. ASPECTS is 10. 2. No emergent large vessel occlusion or high-grade stenosis of the intracranial arteries. 3. Aortic dissection, evaluated completely on concomitant CTA chest. These results were called by telephone at the time of interpretation on 05/23/2022 at 8:57 pm to providers [facility] ; [facility] , who verbally acknowledged these  results. Electronically Signed   By: Ulyses Jarred M.D.   On: 05/23/2022 20:58    Procedures Procedures  {Document cardiac monitor, telemetry assessment procedure when appropriate:1}  Medications Ordered in ED Medications  sodium chloride flush (NS) 0.9 % injection 3 mL (has no administration in time range)  iohexol (OMNIPAQUE) 350 MG/ML injection 80 mL (80 mLs Intravenous Contrast Given 05/23/22 2059)    ED Course/ Medical Decision Making/ A&P                           Medical Decision  Making Amount and/or Complexity of Data Reviewed Labs: ordered. Decision-making details documented in ED Course. Radiology: ordered and independent interpretation performed. Decision-making details documented in ED Course. ECG/medicine tests: ordered and independent interpretation performed. Decision-making details documented in ED Course.  Risk Prescription drug management.  Episode of chest pain now resolved but having right-sided deficits.  Code stroke was activated by EMS.  Seen with neurology team on arrival. Dr. Lorrin Goodell.  CT head is negative for hemorrhage.  Results reviewed and interpreted by me.  CT angiogram of chest will be obtained to rule out acute aortic dissection.  Discussed with Dr. Henderson Baltimore of radiology.  Does have dissection appearance to his aorta on CT but this appears to be unchanged from his previous one in 2019.  CTA results discussed with Dr. Lavonna Monarch of thoracic surgery.  He states as long as appearance is unchanged from 2019 TNK is safe today from his standpoint.  No new dissection today.  Extensive discussion with patient's wife at bedside with Dr. Vianne Bulls of neurology.  There is concern that given patient's cerebral amyloid apathy that he could have hemorrhagic conversion of given TNK.  MRI is attempted to assess the size of these lesions.  Patient unable to tolerate MRI due to claustrophobia.  He vomited several times and was given Ativan and Zofran without relief.  Patient's wife at bedside is wishing to give TNK.  Discussed possibility of intubation to proceed with MRI but patient wishes to try 1 more time without intubation.  {Document critical care time when appropriate:1} {Document review of labs and clinical decision tools ie heart score, Chads2Vasc2 etc:1}  {Document your independent review of radiology images, and any outside records:1} {Document your discussion with family members, caretakers, and with consultants:1} {Document social determinants of  health affecting pt's care:1} {Document your decision making why or why not admission, treatments were needed:1} Final Clinical Impression(s) / ED Diagnoses Final diagnoses:  None    Rx / DC Orders ED Discharge Orders     None

## 2022-05-23 NOTE — ED Triage Notes (Signed)
Pt arrived with [facility] for code stroke. While driving home from dinner, pt started having centralized chest pain. EMS was initially called out for cp. Pt then started having headache with R arm weakness and R sided neglect. Code stroke activated, LKW 2000. R sided weakness resolved enroute. On arrival, pt has R sided field cut, no arm weakness, denies CP and c/o headache. Bilateral 18 g IVs placed pta. EMS VS 179/108, heart rate 80

## 2022-05-23 NOTE — H&P (Signed)
History and Physical    Patrick Woodard [id number] DOB: 08/18/1971 DOA: 05/23/2022  PCP: Gifford Shave, MD  Patient coming from: Home  Chief Complaint: Chest pain  HPI: Patrick Woodard is a 51 y.o. male with medical history significant of COPD, type 2 diabetes, hypertension, ?history of cerebral amyloid angiopathy versus hypertension, history of aortic dissection status post repair in 2014.  EMS was called due to patient having sudden onset chest pain.  When they arrived, they noticed that he had profound right-sided weakness along with right hemianopsia and extinction.  Code stroke was activated.  Weakness completely resolved in 10 minutes and his extinction had improved in the ED but he continued to have persistent right hemianopsia. CT head negative for intracranial hemorrhage.  CTA head and neck negative for LVO.  Given history of aortic dissection with reported chest pain, CT dissection study was done which was showing stable aortic dissection.  CT results were also discussed with cardiothoracic surgery (Dr. Lavonna Monarch) who felt that there was no new dissection today and it was safe to give TNKase from his standpoint.  Brain MRI done and showing left hemisphere watershed distribution infarcts.  Neurology decided not to give TNKase due to more than 10 microhemorrhages on SWI imaging with reported history of cerebral amyloid angiopathy.   Labs showing no leukocytosis, anemia, or thrombocytopenia.  Glucose 231, LFTs normal, blood ethanol level undetectable, initial high-sensitivity troponin negative with repeat pending, D-dimer 3.55.  Patient was given doses of Ativan prior to MRI due to claustrophobia and vomited multiple times in the MRI scanner.  He was given doses of Zofran.  After the episodes of vomiting, his sats dropped to the 80s and was placed on 2 L supplemental oxygen.  Patient states he went to [employer] [place] for dinner.  As he was leaving the restaurant and getting ready to go to [employer], he  experienced sudden onset right-sided chest pain and felt that his right arm was numb.  He is not sure if his right side was weak.  States he was told by EMS that his symptoms were concerning for stroke.  Patient states he is no longer having any chest pain.  He denies any shortness of breath, cough, or fevers.  States he vomited multiple times in the MRI scanner after receiving Ativan but was not vomiting prior to coming into the ED.  Denies abdominal pain or diarrhea.  Review of Systems:  Review of Systems  All other systems reviewed and are negative.   Past Medical History:  Diagnosis Date   Aortic dissection, thoracic (HCC)    COPD (chronic obstructive pulmonary disease) ([place])    Diabetes mellitus without complication ([place])    patient denies   Dyspnea    with activity -    Heart murmur    Hypertension     Past Surgical History:  Procedure Laterality Date   CORONARY ARTERY BYPASS GRAFT  07/2013   repair of torn aorta   HAND SURGERY Left    injury   UMBILICAL HERNIA REPAIR N/A 12/10/2020   Procedure: LAPAROSCOPIC UMBILICAL HERNIA REPAIR WITH MESH;  Surgeon: Jesusita Oka, MD;  Location: [facility];  Service: General;  Laterality: N/A;     reports that he quit smoking about 31 years ago. His smoking use included cigarettes. He has never used smokeless tobacco. He reports that he does not currently use alcohol. He reports that he does not currently use drugs.  Allergies  Allergen Reactions   Latex Dermatitis and Rash  Family History  Problem Relation Age of Onset   High blood pressure Mother    Heart Problems Mother    Aortic dissection Father        x2   Heart Problems Paternal Aunt     Prior to Admission medications   Medication Sig Start Date End Date Taking? Authorizing Provider  Armodafinil 150 MG tablet Take 1 tablet (150 mg total) by mouth daily. 04/20/22   Dohmeier, Asencion Partridge, MD  aspirin 325 MG tablet Take 325 mg by mouth daily.    [provider]   atorvastatin (LIPITOR) 10 MG tablet Take 10 mg by mouth daily.    [provider]  loratadine (CLARITIN) 10 MG tablet Take 10 mg by mouth daily.    [provider]  metoprolol tartrate (LOPRESSOR) 25 MG tablet Take 25 mg by mouth 2 (two) times daily.    [provider]  metoprolol tartrate (LOPRESSOR) 25 MG tablet Take 1.5 tablets by mouth 2 (two) times daily. 01/12/22   [provider]  montelukast (SINGULAIR) 10 MG tablet Take 10 mg by mouth daily.    [provider]  sotalol (BETAPACE) 80 MG tablet Take 80 mg by mouth 2 (two) times daily.    [provider]    Physical Exam: Vitals:   05/23/22 2045 05/23/22 2052 05/23/22 2157 05/23/22 2202  BP: (!) 186/91 (!) 171/84 129/77 125/61  Pulse: 81 81 78 73  Resp: 16     SpO2:  98% 94% 92%  Weight:        Physical Exam Vitals reviewed.  Constitutional:      General: He is not in acute distress. HENT:     Head: Normocephalic and atraumatic.  Eyes:     Extraocular Movements: Extraocular movements intact.  Cardiovascular:     Rate and Rhythm: Normal rate and regular rhythm.     Pulses: Normal pulses.  Pulmonary:     Effort: Pulmonary effort is normal. No respiratory distress.     Breath sounds: Normal breath sounds. No wheezing or rales.  Abdominal:     General: Bowel sounds are normal. There is no distension.     Palpations: Abdomen is soft.     Tenderness: There is no abdominal tenderness.  Musculoskeletal:        General: No swelling or tenderness.     Cervical back: Normal range of motion.  Skin:    General: Skin is warm and dry.  Neurological:     Mental Status: He is alert and oriented to person, place, and time.     Sensory: No sensory deficit.     Motor: No weakness.      Labs on Admission: I have personally reviewed following labs and imaging studies  CBC: Recent Labs  Lab 05/23/22 2034 05/23/22 2042  WBC 9.3  --   NEUTROABS 5.8  --   HGB 14.6 14.3  HCT  40.3 42.0  MCV 91.8  --   PLT 178  --    Basic Metabolic Panel: Recent Labs  Lab 05/23/22 2034 05/23/22 2042  NA 136 139  K 4.0 4.1  CL 102 101  CO2 24  --   GLUCOSE 231* 229*  BUN 16 17  CREATININE 1.04 0.90  CALCIUM 8.9  --    GFR: Estimated Creatinine Clearance: 118.1 mL/min (by C-G formula based on SCr of 0.9 mg/dL). Liver Function Tests: Recent Labs  Lab 05/23/22 2034  AST 22  ALT 22  ALKPHOS 109  BILITOT  0.7  PROT 6.8  ALBUMIN 3.9   No results for input(s): "LIPASE", "AMYLASE" in the last 168 hours. No results for input(s): "AMMONIA" in the last 168 hours. Coagulation Profile: Recent Labs  Lab 05/23/22 2034  INR 1.1   Cardiac Enzymes: No results for input(s): "CKTOTAL", "CKMB", "CKMBINDEX", "TROPONINI" in the last 168 hours. BNP (last 3 results) No results for input(s): "PROBNP" in the last 8760 hours. HbA1C: No results for input(s): "HGBA1C" in the last 72 hours. CBG: Recent Labs  Lab 05/23/22 2031  GLUCAP 176*   Lipid Profile: No results for input(s): "CHOL", "HDL", "LDLCALC", "TRIG", "CHOLHDL", "LDLDIRECT" in the last 72 hours. Thyroid Function Tests: No results for input(s): "TSH", "T4TOTAL", "FREET4", "T3FREE", "THYROIDAB" in the last 72 hours. Anemia Panel: No results for input(s): "VITAMINB12", "FOLATE", "FERRITIN", "TIBC", "IRON", "RETICCTPCT" in the last 72 hours. Urine analysis: No results found for: "COLORURINE", "APPEARANCEUR", "LABSPEC", "PHURINE", "GLUCOSEU", "HGBUR", "BILIRUBINUR", "KETONESUR", "PROTEINUR", "UROBILINOGEN", "NITRITE", "LEUKOCYTESUR"  Radiological Exams on Admission: MR BRAIN WO CONTRAST  Result Date: 05/23/2022 CLINICAL DATA:  Stroke follow-up.  Diagnosis of amyloidosis. EXAM: MRI HEAD WITHOUT CONTRAST TECHNIQUE: Multiplanar, multiecho pulse sequences of the brain and surrounding structures were obtained without intravenous contrast. COMPARISON:  04/09/2018 brain MRI FINDINGS: Brain: Susceptibility weighted imaging  shows remote hemorrhage along the medial aspect of the left thalamus. Additionally, there are scattered chronic microhemorrhages within both hemispheres in a peripheral, lobar distribution. Compared to 04/19/2018, there are a few more hemorrhages now visible (left temporal lobe, cerebellum) but it is not possible to know whether these are truly new hemorrhages or due to increased sensitivity of susceptibility weighted imaging versus the GRE sequence on the older study. There is multifocal periventricular white matter hyperintensity, most often a result of chronic microvascular ischemia. No hydrocephalus or volume loss. Old right occipital infarct. Vascular: Major flow voids are preserved. Skull and upper cervical spine: Normal calvarium and skull base. Visualized upper cervical spine and soft tissues are normal. Sinuses/Orbits:No paranasal sinus fluid levels or advanced mucosal thickening. No mastoid or middle ear effusion. Normal orbits. IMPRESSION: 1. No acute intracranial abnormality. 2. Scattered chronic microhemorrhages in a peripheral, lobar distribution, which is compatible with amyloid angiopathy. 3. 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 Electronically Signed   By: Ulyses Jarred M.D.   On: 05/23/2022 23:22   MR BRAIN WO CONTRAST  Result Date: 05/23/2022 CLINICAL DATA:  Acute neurologic deficit EXAM: MRI HEAD WITHOUT CONTRAST TECHNIQUE: Multiplanar, multiecho pulse sequences of the brain and surrounding structures were obtained without intravenous contrast. COMPARISON:  None Available. FINDINGS: Only diffusion-weighted imaging was performed. This shows areas of abnormal diffusion restriction within the left hemisphere in a watershed distribution, involving the left frontal and parietal white matter and subcortical areas of the left occipital lobe. No midline shift or other mass effect. IMPRESSION: 1. Diffusion-weighted imaging showing left hemisphere watershed distribution infarcts. Electronically Signed   By:  Ulyses Jarred M.D.   On: 05/23/2022 22:03   CT Angio Chest/Abd/Pel for Dissection W and/or W/WO  Result Date: 05/23/2022 CLINICAL DATA:  Chest pain.  Known aortic dissection. EXAM: CT ANGIOGRAPHY CHEST, ABDOMEN AND PELVIS TECHNIQUE: Non-contrast CT of the chest was initially obtained. Multidetector CT imaging through the chest, abdomen and pelvis was performed using the standard protocol during bolus administration of intravenous contrast. Multiplanar reconstructed images and MIPs were obtained and reviewed to evaluate the vascular anatomy. RADIATION DOSE REDUCTION: This exam was performed according to the departmental dose-optimization program which includes automated exposure control, adjustment of the mA  and/or kV according to patient size and/or use of iterative reconstruction technique. CONTRAST:  65mL OMNIPAQUE IOHEXOL 350 MG/ML SOLN COMPARISON:  04/18/2018 FINDINGS: CTA CHEST FINDINGS Cardiovascular: --Heart: The heart size is normal.  There is nopericardial effusion. 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 Prior ascending aortic repair. The dissection extends into the innominate artery and the proximal right common carotid artery, unchanged. The other arch vessels are normal. --Pulmonary Arteries: Contrast timing is optimized for preferential opacification of the aorta. Within that limitation, normal central pulmonary arteries. Mediastinum/Nodes: No mediastinal, hilar or axillary lymphadenopathy. The visualized thyroid and thoracic esophageal course are unremarkable. Lungs/Pleura: No pulmonary nodules or masses. No pleural effusion or pneumothorax. No focal airspace consolidation. No focal pleural abnormality. Musculoskeletal: No chest wall abnormality. No acute osseous findings. Review of the MIP images confirms the above findings. CTA ABDOMEN AND PELVIS FINDINGS VASCULAR Aorta: Dissection extends to the level  of the renal arteries. There is mild aortic atherosclerosis. Celiac: Arises from the true lumen.  Patent SMA: Arises from the true lumen. Renals: Arise below the level of the dissection flap. No aneurysm, dissection, stenosis or evidence of fibromuscular dysplasia. IMA: Patent without abnormality. Inflow: No aneurysm, stenosis or dissection. Veins: Normal course and caliber of the major veins. Assessment is otherwise limited by the arterial dominant contrast phase. Review of the MIP images confirms the above findings. NON-VASCULAR Hepatobiliary: Normal hepatic contours and density. No visible biliary dilatation. Normal gallbladder. Pancreas: Normal contours without ductal dilatation. No peripancreatic fluid collection. Spleen: Normal arterial phase splenic enhancement pattern. Adrenals/Urinary Tract: --Adrenal glands: Normal. --Right kidney/ureter: No hydronephrosis or perinephric stranding. No nephrolithiasis. No obstructing ureteral stones. --Left kidney/ureter: No hydronephrosis or perinephric stranding. No nephrolithiasis. No obstructing ureteral stones. --Urinary bladder: Unremarkable. Stomach/Bowel: --Stomach/Duodenum: No hiatal hernia or other gastric abnormality. Normal duodenal course and caliber. --Small bowel: No dilatation or inflammation. --Colon: No focal abnormality. --Appendix: Normal. Lymphatic:  No abdominal or pelvic lymphadenopathy. Reproductive: Prostate is enlarged. Musculoskeletal. No bony spinal canal stenosis or focal osseous abnormality. Other: None. Review of the MIP images confirms the above findings. IMPRESSION: 1. Unchanged appearance of type a aortic dissection extending from proximal to the innominate artery to the level of the renal arteries, status post ascending aortic repair. 2. No new dissection or other acute abnormality of the chest, abdomen or pelvis. 3. Aortic Atherosclerosis (ICD10-I70.0). These results were called by telephone at the time of interpretation on 05/23/2022 at  8:53 pm to provider Dr. Wyvonnia Dusky, who verbally acknowledged these results. Electronically Signed   By: Ulyses Jarred M.D.   On: 05/23/2022 21:13   CT HEAD CODE STROKE WO CONTRAST  Result Date: 05/23/2022 CLINICAL DATA:  Stroke-like symptoms EXAM: CT HEAD WITHOUT CONTRAST CT ANGIOGRAPHY OF THE HEAD AND NECK TECHNIQUE: Contiguous axial images were obtained from the base of the skull through the vertex without intravenous contrast. Multidetector CT imaging of the head and neck was performed using the standard protocol during bolus administration of intravenous contrast. Multiplanar CT image reconstructions and MIPs were obtained to evaluate the vascular anatomy. Carotid stenosis measurements (when applicable) are obtained utilizing NASCET criteria, using the distal internal carotid diameter as the denominator. RADIATION DOSE REDUCTION: This exam was performed according to the departmental dose-optimization program which includes automated exposure control, adjustment of the mA and/or kV according to patient size and/or use of iterative reconstruction technique. CONTRAST:  Ten 90 little burr COMPARISON:  None Available. FINDINGS: CT HEAD FINDINGS Brain: There is no mass, hemorrhage or extra-axial collection. The size and configuration of the ventricles and extra-axial CSF spaces are normal. The brain parenchyma is normal, without evidence of acute or chronic infarction. Vascular: No abnormal hyperdensity of the major intracranial arteries or dural venous sinuses. No intracranial atherosclerosis. Skull: The visualized skull base, calvarium and extracranial soft tissues are normal. Sinuses/Orbits: No fluid levels or advanced mucosal thickening of the visualized paranasal sinuses. No mastoid or middle ear effusion. The orbits are normal. ASPECTS ([place] Stroke Program Early CT Score) - Ganglionic level infarction (caudate, lentiform nuclei, internal capsule, insula, M1-M3 cortex): 7 - Supraganglionic infarction (M4-M6  cortex): 3 Total score (0-10 with 10 being normal): 10 CTA NECK FINDINGS Aortic dissection is evaluated completely on concomitant CTA chest. RIGHT CAROTID SYSTEM: Skeleton: Normal. Other neck: Unremarkable. LEFT CAROTID SYSTEM: Normal without aneurysm, dissection or stenosis. VERTEBRAL ARTERIES: Left dominant configuration. Both origins are clearly patent. There is no dissection, occlusion or flow-limiting stenosis to the skull base (V1-V3 segments). CTA HEAD FINDINGS POSTERIOR CIRCULATION: --Vertebral arteries: Normal V4 segments. --Inferior cerebellar arteries: Normal. --Basilar artery: Normal. --Superior cerebellar arteries: Normal. --Posterior cerebral arteries (PCA): Normal. ANTERIOR CIRCULATION: --Intracranial internal carotid arteries: Normal. --Anterior cerebral arteries (ACA): Normal. Both A1 segments are present. Patent anterior communicating artery (a-comm). --Middle cerebral arteries (MCA): Normal. VENOUS SINUSES: As permitted by contrast timing, patent. ANATOMIC VARIANTS: None Review of the MIP images confirms the above findings. IMPRESSION: 1. No intracranial hemorrhage or mass lesion. ASPECTS is 10. 2. No emergent large vessel occlusion or high-grade stenosis of the intracranial arteries. 3. Aortic dissection, evaluated completely on concomitant CTA chest. These results were called by telephone at the time of interpretation on 05/23/2022 at 8:57 pm to providers [facility] ; [facility] , who verbally acknowledged these results. Electronically Signed   By: Ulyses Jarred M.D.   On: 05/23/2022 20:58   CT ANGIO HEAD NECK W WO CM (CODE STROKE)  Result Date: 05/23/2022 CLINICAL DATA:  Stroke-like symptoms EXAM: CT HEAD WITHOUT CONTRAST CT ANGIOGRAPHY OF THE HEAD AND NECK TECHNIQUE: Contiguous axial images were obtained from the base of the skull through the vertex without intravenous contrast. Multidetector CT imaging of the head and neck was performed using the standard protocol during bolus  administration of intravenous contrast. Multiplanar CT image reconstructions and MIPs were obtained to evaluate the vascular anatomy. Carotid stenosis measurements (when applicable) are obtained utilizing NASCET criteria, using the distal internal carotid diameter as the denominator. RADIATION DOSE REDUCTION: This exam was performed according to the departmental dose-optimization program which includes automated exposure control, adjustment of the mA and/or kV according to patient size and/or use of iterative reconstruction technique. CONTRAST:  Ten 90 little burr COMPARISON:  None Available. FINDINGS: CT HEAD FINDINGS Brain: There is no mass, hemorrhage or extra-axial collection. The size and configuration of the ventricles and extra-axial CSF spaces are normal. The brain parenchyma is normal, without evidence of acute or chronic infarction. Vascular: No abnormal hyperdensity of the major intracranial arteries or dural venous sinuses. No intracranial atherosclerosis. Skull: The visualized skull base, calvarium and extracranial soft tissues are normal. Sinuses/Orbits: No fluid levels or advanced mucosal thickening of the visualized paranasal sinuses. No mastoid or middle ear effusion. The orbits are normal. ASPECTS [facility] Stroke Program Early CT Score) - Ganglionic level infarction (caudate, lentiform nuclei, internal capsule, insula, M1-M3 cortex): 7 - Supraganglionic infarction (M4-M6 cortex): 3 Total score (0-10 with 10 being normal): 10 CTA NECK  FINDINGS Aortic dissection is evaluated completely on concomitant CTA chest. RIGHT CAROTID SYSTEM: Skeleton: Normal. Other neck: Unremarkable. LEFT CAROTID SYSTEM: Normal without aneurysm, dissection or stenosis. VERTEBRAL ARTERIES: Left dominant configuration. Both origins are clearly patent. There is no dissection, occlusion or flow-limiting stenosis to the skull base (V1-V3 segments). CTA HEAD FINDINGS POSTERIOR CIRCULATION: --Vertebral arteries: Normal V4 segments.  --Inferior cerebellar arteries: Normal. --Basilar artery: Normal. --Superior cerebellar arteries: Normal. --Posterior cerebral arteries (PCA): Normal. ANTERIOR CIRCULATION: --Intracranial internal carotid arteries: Normal. --Anterior cerebral arteries (ACA): Normal. Both A1 segments are present. Patent anterior communicating artery (a-comm). --Middle cerebral arteries (MCA): Normal. VENOUS SINUSES: As permitted by contrast timing, patent. ANATOMIC VARIANTS: None Review of the MIP images confirms the above findings. IMPRESSION: 1. No intracranial hemorrhage or mass lesion. ASPECTS is 10. 2. No emergent large vessel occlusion or high-grade stenosis of the intracranial arteries. 3. Aortic dissection, evaluated completely on concomitant CTA chest. These results were called by telephone at the time of interpretation on 05/23/2022 at 8:57 pm to providers [facility] ; [facility] , who verbally acknowledged these results. Electronically Signed   By: Ulyses Jarred M.D.   On: 05/23/2022 20:58    EKG: Independently reviewed.  Sinus rhythm, LAFB, RBBB.  No significant change since prior tracing.  Assessment and Plan  Acute CVA Patient brought in as code stroke for evaluation of acute onset profound right-sided weakness along with right hemianopsia and extinction.  Weakness completely resolved in 10 minutes and his extinction had improved in the ED but he continued to have persistent right hemianopsia. CT head negative for intracranial hemorrhage.  CTA head and neck negative for LVO.  Brain MRI done and showing left hemisphere watershed distribution infarcts.  Neurology decided not to give TNKase due to more than 10 microhemorrhages on SWI imaging with reported history of cerebral amyloid angiopathy.  -Appreciate neurology recommendations -Telemetry monitoring -Echocardiogram -Hemoglobin A1c, lipid panel -Neurology recommended starting aspirin 81 mg daily along with Plavix 75 mg daily x21 days, followed by  aspirin 81 mg daily alone. -Recommending DVT prophylaxis -Recommending permissive hypertension for the first 24 hours, <220/110.  Hold home antihypertensives. -Recommending head of the bed flat, strict bedrest for 48 hours, and IV fluids at 125 cc/h for 48 hours. -Frequent neurochecks -PT, OT, speech therapy. -N.p.o. until cleared by bedside swallow evaluation or formal speech evaluation   Chest pain Acute hypoxemic respiratory failure Patient experienced sudden onset right-sided chest pain after he finished eating dinner at a restaurant.  ACS less likely as high-sensitivity troponin negative and EKG without acute ischemic changes.  CT dissection study showing stable aortic dissection, results were also discussed with cardiothoracic surgeon who felt that there was no new dissection today.  Patient was not hypoxic when he came into the ED. He is not tachycardic.  He sats dropped to the 80s after he received Ativan and vomited multiple times in the MRI scanner.  D-dimer positive.  I spoke to radiologist Dr. Collins [place] who informed me that there is no evidence of PE on the CT angiogram chest. At present, he is satting well on 2 L supplemental oxygen.  Not endorsing any chest pain or dyspnea.  Repeat chest x-ray done and showing no acute cardiopulmonary process.  -Given concern for aspiration, will give Unasyn -Repeat troponin pending -Bilateral lower extremity Dopplers given positive D-dimer -COVID test pending -Continue supplemental oxygen, wean as tolerated  Emesis Occurred after he received doses of Ativan.  Patient denies any nausea or vomiting at home, denies  abdominal pain.  Abdominal exam is benign.  LFTs normal.  He is no longer vomiting. -Symptomatic management  COPD -Stable, no signs of acute exacerbation.  Not on any inhalers at home.  Type 2 diabetes Not on medications.  Glucose in the low 200s. -Check A1c -Sensitive sliding scale insulin ACHS  Hypertension -Allow permissive  hypertension at this time  Hyperlipidemia -Continue Lipitor  DVT prophylaxis: SQ Heparin Code Status: Full Code Family Communication: No family available at this time. Level of care: Progressive Care Unit Admission status: It is my clinical opinion that admission to INPATIENT is reasonable and necessary because of the expectation that this patient will require hospital care that crosses at least 2 midnights to treat this condition based on the medical complexity of the problems presented.  Given the aforementioned information, the predictability of an adverse outcome is felt to be significant.   Shela Leff MD [facility]  If 7PM-7AM, please contact night-coverage [email or web address]  05/23/2022, 11:35 PM

## 2022-05-23 NOTE — H&P (Incomplete)
History and Physical    Patrick Woodard [id number] DOB: [birth year] DOA: 05/23/2022  PCP: Gifford Shave, MD  Patient coming from: {Blank single:19197::"Home","SNF","ALF/ILF","Group Home","BHH","CIR","Hospice","Homeless","[facility] ED","[facility] ED","Outside Hospital","***"}  Chief Complaint: ***  HPI: Patrick Woodard is a 51 y.o. male with medical history significant of COPD, type 2 diabetes, hypertension, ?history of cerebral amyloid angiopathy versus hypertension, history of aortic dissection status post repair in 2014.  EMS was called due to patient having sudden onset chest pain.  When they arrived, they noticed that he had profound right-sided weakness along with right hemianopsia and extinction.  Code stroke was activated.  Weakness completely resolved in 10 minutes and his extinction had improved in the ED but he continued to have persistent right hemianopsia. CT head negative for intracranial hemorrhage.  CTA head and neck negative for LVO.  Given history of aortic dissection with reported chest pain, CT dissection study was done which was showing stable aortic dissection.  CT results were also discussed with cardiothoracic surgery (Dr. Lavonna Monarch) who felt that there was no new dissection today and it was safe to give TNKase from his standpoint.  Brain MRI done and showing left hemisphere watershed distribution infarcts.  Neurology decided not to give TNKase due to more than 10 microhemorrhages on SWI imaging with reported history of cerebral amyloid angiopathy.   Labs showing no leukocytosis, anemia, or thrombocytopenia.  Glucose 231, LFTs normal, blood ethanol level undetectable, initial high-sensitivity troponin negative with repeat pending, D-dimer 3.55.  Patient was given doses of Ativan prior to MRI due to claustrophobia and vomited multiple times in the MRI scanner.  He was given doses of Zofran.  After the episodes of vomiting, his sats dropped to the 80s and was placed on 2 L supplemental oxygen.  ED  Course: ***  Review of Systems:  ROS  Past Medical History:  Diagnosis Date  . Aortic dissection, thoracic ([place])   . COPD (chronic obstructive pulmonary disease) ([place])   . Diabetes mellitus without complication ([place])    patient denies  . Dyspnea    with activity -   . Heart murmur   . Hypertension     Past Surgical History:  Procedure Laterality Date  . 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 HAND SURGERY Left    injury  . UMBILICAL HERNIA REPAIR N/A 12/10/2020   Procedure: LAPAROSCOPIC UMBILICAL HERNIA REPAIR WITH MESH;  Surgeon: Jesusita Oka, MD;  Location: [facility];  Service: General;  Laterality: N/A;     reports that he quit smoking about 31 years ago. His smoking use included cigarettes. He has never used smokeless tobacco. He reports that he does not currently use alcohol. He reports that he does not currently use drugs.  Allergies  Allergen Reactions  . Latex Dermatitis and Rash    Family History  Problem Relation Age of Onset  . High blood pressure Mother   . Heart Problems Mother   . Aortic dissection Father        x2  . Heart Problems Paternal Aunt     Prior to Admission medications   Medication Sig Start Date End Date Taking? Authorizing Provider  Armodafinil 150 MG tablet Take 1 tablet (150 mg total) by mouth daily. 04/20/22   Dohmeier, Asencion Partridge, MD  aspirin 325 MG tablet Take 325 mg by mouth daily.    [provider]  atorvastatin (LIPITOR) 10 MG tablet Take 10 mg by mouth daily.    [provider]  loratadine (CLARITIN) 10 MG tablet Take 10 mg by mouth daily.    [provider]  metoprolol tartrate (LOPRESSOR) 25 MG tablet Take 25 mg by mouth 2 (two) times daily.    [provider]  metoprolol tartrate (LOPRESSOR) 25 MG tablet Take 1.5 tablets by mouth 2 (two) times daily. 01/12/22   [provider]  montelukast (SINGULAIR) 10 MG tablet Take 10 mg by mouth daily.    [provider]  sotalol (BETAPACE) 80 MG tablet Take 80 mg by mouth 2 (two) times daily.    [provider]    Physical Exam: Vitals:   05/23/22 2045 05/23/22 2052 05/23/22 2157 05/23/22 2202  BP: (!) 186/91 (!) 171/84 129/77 125/61  Pulse: 81 81 78 73  Resp: 16     SpO2:  98% 94% 92%  Weight:        Physical Exam   Labs on Admission: I have personally reviewed following labs and imaging studies  CBC: Recent Labs  Lab 05/23/22 2034 05/23/22 2042  WBC 9.3  --   NEUTROABS 5.8  --   HGB 14.6 14.3  HCT 40.3 42.0  MCV 91.8  --   PLT 178  --    Basic Metabolic Panel: Recent Labs  Lab 05/23/22 2034 05/23/22 2042  NA 136 139  K 4.0 4.1  CL 102 101  CO2 24  --   GLUCOSE 231* 229*  BUN 16 17  CREATININE 1.04 0.90  CALCIUM 8.9  --    GFR: Estimated Creatinine Clearance: 118.1 mL/min (by C-G formula based on SCr of 0.9 mg/dL). Liver Function Tests: Recent Labs  Lab 05/23/22 2034  AST 22  ALT 22  ALKPHOS 109  BILITOT 0.7  PROT 6.8  ALBUMIN 3.9   No results for input(s): "LIPASE", "AMYLASE" in the last 168 hours. No results for input(s): "AMMONIA" in the last 168 hours. Coagulation Profile: Recent Labs  Lab 05/23/22 2034  INR 1.1   Cardiac Enzymes: No results for input(s): "CKTOTAL", "CKMB", "CKMBINDEX", "TROPONINI" in the last 168 hours. BNP (last 3 results) No results for input(s): "PROBNP" in the last 8760 hours. HbA1C: No results for input(s): "HGBA1C" in the last 72 hours. CBG: Recent Labs  Lab 05/23/22 2031  GLUCAP 176*   Lipid Profile: No results for input(s): "CHOL", "HDL", "LDLCALC", "TRIG", "CHOLHDL", "LDLDIRECT" in the last 72 hours. Thyroid Function Tests: No results for input(s): "TSH", "T4TOTAL", "FREET4", "T3FREE", "THYROIDAB" in the last 72 hours. Anemia Panel: No results for input(s): "VITAMINB12", "FOLATE", "FERRITIN", "TIBC", "IRON", "RETICCTPCT" in the last 72 hours. Urine analysis: No results found for: "COLORURINE",  "APPEARANCEUR", "LABSPEC", "PHURINE", "GLUCOSEU", "HGBUR", "BILIRUBINUR", "KETONESUR", "PROTEINUR", "UROBILINOGEN", "NITRITE", "LEUKOCYTESUR"  Radiological Exams on Admission: MR BRAIN WO CONTRAST  Result Date: 05/23/2022 CLINICAL DATA:  Stroke follow-up.  Diagnosis of amyloidosis. EXAM: MRI HEAD WITHOUT CONTRAST TECHNIQUE: Multiplanar, multiecho pulse sequences of the brain and surrounding structures were obtained without intravenous contrast. COMPARISON:  04/09/2018 brain MRI FINDINGS: Brain: Susceptibility weighted imaging shows remote hemorrhage along the medial aspect of the left thalamus. Additionally, there are scattered chronic microhemorrhages within both hemispheres in a peripheral, lobar distribution. Compared to 04/19/2018, there are a few more hemorrhages now visible (left temporal lobe, cerebellum) but it is not possible to know whether these are truly new hemorrhages or due to increased sensitivity of susceptibility weighted imaging versus the GRE sequence on the older study. There is multifocal periventricular white matter hyperintensity, most often a result of chronic microvascular ischemia. No  hydrocephalus or volume loss. Old right occipital infarct. Vascular: Major flow voids are preserved. Skull and upper cervical spine: Normal calvarium and skull base. Visualized upper cervical spine and soft tissues are normal. Sinuses/Orbits:No paranasal sinus fluid levels or advanced mucosal thickening. No mastoid or middle ear effusion. Normal orbits. IMPRESSION: 1. No acute intracranial abnormality. 2. Scattered chronic microhemorrhages in a peripheral, lobar distribution, which is compatible with amyloid angiopathy. 3. 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 Electronically Signed   By: Ulyses Jarred M.D.   On: 05/23/2022 23:22   MR BRAIN WO CONTRAST  Result Date: 05/23/2022 CLINICAL DATA:  Acute neurologic deficit EXAM: MRI HEAD WITHOUT CONTRAST TECHNIQUE: Multiplanar, multiecho pulse sequences of the  brain and surrounding structures were obtained without intravenous contrast. COMPARISON:  None Available. FINDINGS: Only diffusion-weighted imaging was performed. This shows areas of abnormal diffusion restriction within the left hemisphere in a watershed distribution, involving the left frontal and parietal white matter and subcortical areas of the left occipital lobe. No midline shift or other mass effect. IMPRESSION: 1. Diffusion-weighted imaging showing left hemisphere watershed distribution infarcts. Electronically Signed   By: Ulyses Jarred M.D.   On: 05/23/2022 22:03   CT Angio Chest/Abd/Pel for Dissection W and/or W/WO  Result Date: 05/23/2022 CLINICAL DATA:  Chest pain.  Known aortic dissection. EXAM: CT ANGIOGRAPHY CHEST, ABDOMEN AND PELVIS TECHNIQUE: Non-contrast CT of the chest was initially obtained. Multidetector CT imaging through the chest, abdomen and pelvis was performed using the standard protocol during bolus administration of intravenous contrast. Multiplanar reconstructed images and MIPs were obtained and reviewed to evaluate the vascular anatomy. RADIATION DOSE REDUCTION: This exam was performed according to the departmental dose-optimization program which includes automated exposure control, adjustment of the mA and/or kV according to patient size and/or use of iterative reconstruction technique. CONTRAST:  65mL OMNIPAQUE IOHEXOL 350 MG/ML SOLN COMPARISON:  04/18/2018 FINDINGS: CTA CHEST FINDINGS Cardiovascular: --Heart: The heart size is normal.  There is nopericardial effusion. 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 Prior ascending aortic repair. The dissection extends into the innominate artery and the proximal right common carotid artery, unchanged. The other arch vessels are normal. --Pulmonary Arteries: Contrast timing is optimized for preferential opacification of the aorta. Within that  limitation, normal central pulmonary arteries. Mediastinum/Nodes: No mediastinal, hilar or axillary lymphadenopathy. The visualized thyroid and thoracic esophageal course are unremarkable. Lungs/Pleura: No pulmonary nodules or masses. No pleural effusion or pneumothorax. No focal airspace consolidation. No focal pleural abnormality. Musculoskeletal: No chest wall abnormality. No acute osseous findings. Review of the MIP images confirms the above findings. CTA ABDOMEN AND PELVIS FINDINGS VASCULAR Aorta: Dissection extends to the level of the renal arteries. There is mild aortic atherosclerosis. Celiac: Arises from the true lumen.  Patent SMA: Arises from the true lumen. Renals: Arise below the level of the dissection flap. No aneurysm, dissection, stenosis or evidence of fibromuscular dysplasia. IMA: Patent without abnormality. Inflow: No aneurysm, stenosis or dissection. Veins: Normal course and caliber of the major veins. Assessment is otherwise limited by the arterial dominant contrast phase. Review of the MIP images confirms the above findings. NON-VASCULAR Hepatobiliary: Normal hepatic contours and density. No visible biliary dilatation. Normal gallbladder. Pancreas: Normal contours without ductal dilatation. No peripancreatic fluid collection. Spleen: Normal arterial phase splenic enhancement pattern. Adrenals/Urinary Tract: --Adrenal glands: Normal. --Right kidney/ureter: No hydronephrosis or perinephric stranding. No nephrolithiasis. No obstructing ureteral stones. --Left kidney/ureter: No hydronephrosis or  perinephric stranding. No nephrolithiasis. No obstructing ureteral stones. --Urinary bladder: Unremarkable. Stomach/Bowel: --Stomach/Duodenum: No hiatal hernia or other gastric abnormality. Normal duodenal course and caliber. --Small bowel: No dilatation or inflammation. --Colon: No focal abnormality. --Appendix: Normal. Lymphatic:  No abdominal or pelvic lymphadenopathy. Reproductive: Prostate is  enlarged. Musculoskeletal. No bony spinal canal stenosis or focal osseous abnormality. Other: None. Review of the MIP images confirms the above findings. IMPRESSION: 1. Unchanged appearance of type a aortic dissection extending from proximal to the innominate artery to the level of the renal arteries, status post ascending aortic repair. 2. No new dissection or other acute abnormality of the chest, abdomen or pelvis. 3. Aortic Atherosclerosis (ICD10-I70.0). These results were called by telephone at the time of interpretation on 05/23/2022 at 8:53 pm to provider Dr. Wyvonnia Dusky, who verbally acknowledged these results. Electronically Signed   By: Ulyses Jarred M.D.   On: 05/23/2022 21:13   CT HEAD CODE STROKE WO CONTRAST  Result Date: 05/23/2022 CLINICAL DATA:  Stroke-like symptoms EXAM: CT HEAD WITHOUT CONTRAST CT ANGIOGRAPHY OF THE HEAD AND NECK TECHNIQUE: Contiguous axial images were obtained from the base of the skull through the vertex without intravenous contrast. Multidetector CT imaging of the head and neck was performed using the standard protocol during bolus administration of intravenous contrast. Multiplanar CT image reconstructions and MIPs were obtained to evaluate the vascular anatomy. Carotid stenosis measurements (when applicable) are obtained utilizing NASCET criteria, using the distal internal carotid diameter as the denominator. RADIATION DOSE REDUCTION: This exam was performed according to the departmental dose-optimization program which includes automated exposure control, adjustment of the mA and/or kV according to patient size and/or use of iterative reconstruction technique. CONTRAST:  Ten 90 little burr COMPARISON:  None Available. FINDINGS: CT HEAD FINDINGS Brain: There is no mass, hemorrhage or extra-axial collection. The size and configuration of the ventricles and extra-axial CSF spaces are normal. The brain parenchyma is normal, without evidence of acute or chronic infarction. Vascular:  No abnormal hyperdensity of the major intracranial arteries or dural venous sinuses. No intracranial atherosclerosis. Skull: The visualized skull base, calvarium and extracranial soft tissues are normal. Sinuses/Orbits: No fluid levels or advanced mucosal thickening of the visualized paranasal sinuses. No mastoid or middle ear effusion. The orbits are normal. ASPECTS ([place] Stroke Program Early CT Score) - Ganglionic level infarction (caudate, lentiform nuclei, internal capsule, insula, M1-M3 cortex): 7 - Supraganglionic infarction (M4-M6 cortex): 3 Total score (0-10 with 10 being normal): 10 CTA NECK FINDINGS Aortic dissection is evaluated completely on concomitant CTA chest. RIGHT CAROTID SYSTEM: Skeleton: Normal. Other neck: Unremarkable. LEFT CAROTID SYSTEM: Normal without aneurysm, dissection or stenosis. VERTEBRAL ARTERIES: Left dominant configuration. Both origins are clearly patent. There is no dissection, occlusion or flow-limiting stenosis to the skull base (V1-V3 segments). CTA HEAD FINDINGS POSTERIOR CIRCULATION: --Vertebral arteries: Normal V4 segments. --Inferior cerebellar arteries: Normal. --Basilar artery: Normal. --Superior cerebellar arteries: Normal. --Posterior cerebral arteries (PCA): Normal. ANTERIOR CIRCULATION: --Intracranial internal carotid arteries: Normal. --Anterior cerebral arteries (ACA): Normal. Both A1 segments are present. Patent anterior communicating artery (a-comm). --Middle cerebral arteries (MCA): Normal. VENOUS SINUSES: As permitted by contrast timing, patent. ANATOMIC VARIANTS: None Review of the MIP images confirms the above findings. IMPRESSION: 1. No intracranial hemorrhage or mass lesion. ASPECTS is 10. 2. No emergent large vessel occlusion or high-grade stenosis of the intracranial arteries. 3. Aortic dissection, evaluated completely on concomitant CTA chest. These results were called by telephone at the time of interpretation on 05/23/2022 at 8:57 pm to providers  STEPHEN RANCOUR ; Donnetta Simpers , who verbally acknowledged these results. Electronically Signed   By: Ulyses Jarred M.D.   On: 05/23/2022 20:58   CT ANGIO HEAD NECK W WO CM (CODE STROKE)  Result Date: 05/23/2022 CLINICAL DATA:  Stroke-like symptoms EXAM: CT HEAD WITHOUT CONTRAST CT ANGIOGRAPHY OF THE HEAD AND NECK TECHNIQUE: Contiguous axial images were obtained from the base of the skull through the vertex without intravenous contrast. Multidetector CT imaging of the head and neck was performed using the standard protocol during bolus administration of intravenous contrast. Multiplanar CT image reconstructions and MIPs were obtained to evaluate the vascular anatomy. Carotid stenosis measurements (when applicable) are obtained utilizing NASCET criteria, using the distal internal carotid diameter as the denominator. RADIATION DOSE REDUCTION: This exam was performed according to the departmental dose-optimization program which includes automated exposure control, adjustment of the mA and/or kV according to patient size and/or use of iterative reconstruction technique. CONTRAST:  Ten 90 little burr COMPARISON:  None Available. FINDINGS: CT HEAD FINDINGS Brain: There is no mass, hemorrhage or extra-axial collection. The size and configuration of the ventricles and extra-axial CSF spaces are normal. The brain parenchyma is normal, without evidence of acute or chronic infarction. Vascular: No abnormal hyperdensity of the major intracranial arteries or dural venous sinuses. No intracranial atherosclerosis. Skull: The visualized skull base, calvarium and extracranial soft tissues are normal. Sinuses/Orbits: No fluid levels or advanced mucosal thickening of the visualized paranasal sinuses. No mastoid or middle ear effusion. The orbits are normal. ASPECTS ([place] Stroke Program Early CT Score) - Ganglionic level infarction (caudate, lentiform nuclei, internal capsule, insula, M1-M3 cortex): 7 - Supraganglionic  infarction (M4-M6 cortex): 3 Total score (0-10 with 10 being normal): 10 CTA NECK FINDINGS Aortic dissection is evaluated completely on concomitant CTA chest. RIGHT CAROTID SYSTEM: Skeleton: Normal. Other neck: Unremarkable. LEFT CAROTID SYSTEM: Normal without aneurysm, dissection or stenosis. VERTEBRAL ARTERIES: Left dominant configuration. Both origins are clearly patent. There is no dissection, occlusion or flow-limiting stenosis to the skull base (V1-V3 segments). CTA HEAD FINDINGS POSTERIOR CIRCULATION: --Vertebral arteries: Normal V4 segments. --Inferior cerebellar arteries: Normal. --Basilar artery: Normal. --Superior cerebellar arteries: Normal. --Posterior cerebral arteries (PCA): Normal. ANTERIOR CIRCULATION: --Intracranial internal carotid arteries: Normal. --Anterior cerebral arteries (ACA): Normal. Both A1 segments are present. Patent anterior communicating artery (a-comm). --Middle cerebral arteries (MCA): Normal. VENOUS SINUSES: As permitted by contrast timing, patent. ANATOMIC VARIANTS: None Review of the MIP images confirms the above findings. IMPRESSION: 1. No intracranial hemorrhage or mass lesion. ASPECTS is 10. 2. No emergent large vessel occlusion or high-grade stenosis of the intracranial arteries. 3. Aortic dissection, evaluated completely on concomitant CTA chest. These results were called by telephone at the time of interpretation on 05/23/2022 at 8:57 pm to providers [facility] ; [facility] , who verbally acknowledged these results. Electronically Signed   By: Ulyses Jarred M.D.   On: 05/23/2022 20:58    EKG: Independently reviewed.  Sinus rhythm, LAFB, RBBB.  Assessment and Plan  Acute CVA   -Telemetry monitoring -Echocardiogram -Hemoglobin A1c, lipid panel -Neurology recommended starting aspirin 81 mg daily along with Plavix 75 mg daily x21 days, followed by aspirin 81 mg daily alone. -Recommending DVT prophylaxis -Recommending permissive hypertension for the  first 24 hours, <220/110.  Hold home antihypertensives. -Recommending head of the bed flat, strict bedrest for 48 hours, and IV fluids at 125 cc/h for 48 hours. -Frequent neurochecks -PT, OT, speech therapy. -N.p.o. until cleared by bedside swallow evaluation or formal speech evaluation  DVT prophylaxis: {Blank single:19197::"Lovenox","SQ Heparin","IV heparin gtt","Xarelto","Eliquis","Coumadin","SCDs","***"} Code Status: {Blank single:19197::"Full Code","DNR","DNR/DNI","Comfort Care","***"} Family Communication: ***  Consults called: ***  Level of care: {Blank single:19197::"Med-Surg","Telemetry bed","Progressive Care Unit","Step Down Unit"} Admission status: ***  Shela Leff MD [facility]  If 7PM-7AM, please contact night-coverage [email or web address]  05/23/2022, 11:35 PM

## 2022-05-23 NOTE — ED Notes (Signed)
Return from MRI. Patient vomited and aspirated during second attempt at MRI

## 2022-05-23 NOTE — ED Notes (Signed)
Pt pulled from MRI after vomiting; additional meds given; linens and clothing changed.

## 2022-05-23 NOTE — ED Notes (Signed)
Pt claustrophobic, verbal order for 1mg  IV Ativan obtained from [facility]

## 2022-05-23 NOTE — Code Documentation (Signed)
Responded to Code Stroke called at 2018 for R sided weakness, LSN-2000. Pt arrived at 2027, CBG-176, NIH-3 for R hemianopia and R sided neglect/extinction, CT head negative for acute changes, CTA-no LVO. Pt taken to MRI d/t h/o cerebral amyloid angiopathy. This showed watershed infarcts in MCA/PCA/ACA distribution and microhemorrhages. TNk not given-microhemorrhages. Plan permissive htn, HOB flat, IVF, and bed rest x 48 hours.

## 2022-05-24 ENCOUNTER — Inpatient Hospital Stay (HOSPITAL_COMMUNITY): Payer: BC Managed Care – PPO

## 2022-05-24 ENCOUNTER — Encounter (HOSPITAL_COMMUNITY): Payer: Self-pay | Admitting: Internal Medicine

## 2022-05-24 ENCOUNTER — Other Ambulatory Visit (HOSPITAL_COMMUNITY): Payer: BC Managed Care – PPO

## 2022-05-24 DIAGNOSIS — I639 Cerebral infarction, unspecified: Secondary | ICD-10-CM

## 2022-05-24 DIAGNOSIS — R1110 Vomiting, unspecified: Secondary | ICD-10-CM

## 2022-05-24 DIAGNOSIS — J449 Chronic obstructive pulmonary disease, unspecified: Secondary | ICD-10-CM

## 2022-05-24 DIAGNOSIS — I10 Essential (primary) hypertension: Secondary | ICD-10-CM

## 2022-05-24 DIAGNOSIS — E785 Hyperlipidemia, unspecified: Secondary | ICD-10-CM

## 2022-05-24 DIAGNOSIS — R7989 Other specified abnormal findings of blood chemistry: Secondary | ICD-10-CM

## 2022-05-24 DIAGNOSIS — R079 Chest pain, unspecified: Secondary | ICD-10-CM | POA: Diagnosis not present

## 2022-05-24 DIAGNOSIS — E119 Type 2 diabetes mellitus without complications: Secondary | ICD-10-CM

## 2022-05-24 DIAGNOSIS — R1111 Vomiting without nausea: Secondary | ICD-10-CM

## 2022-05-24 DIAGNOSIS — J9601 Acute respiratory failure with hypoxia: Secondary | ICD-10-CM

## 2022-05-24 LAB — BASIC METABOLIC PANEL
Anion gap: 9 (ref 5–15)
BUN: 12 mg/dL (ref 6–20)
CO2: 23 mmol/L (ref 22–32)
Calcium: 8.5 mg/dL — ABNORMAL LOW (ref 8.9–10.3)
Chloride: 104 mmol/L (ref 98–111)
Creatinine, Ser: 1.01 mg/dL (ref 0.61–1.24)
GFR, Estimated: >60 mL/min (ref >60)
Glucose, Bld: 172 mg/dL — ABNORMAL HIGH (ref 70–99)
Potassium: 4.2 mmol/L (ref 3.5–5.1)
Sodium: 136 mmol/L (ref 135–145)

## 2022-05-24 LAB — RAPID URINE DRUG SCREEN, HOSP PERFORMED
Amphetamines: NOT DETECTED
Barbiturates: NOT DETECTED
Benzodiazepines: POSITIVE — AB
Cocaine: NOT DETECTED
Opiates: NOT DETECTED
Tetrahydrocannabinol: NOT DETECTED

## 2022-05-24 LAB — HEMOGLOBIN A1C
Hgb A1c MFr Bld: 7.6 % — ABNORMAL HIGH (ref 4.8–5.6)
Mean Plasma Glucose: 171.42 mg/dL

## 2022-05-24 LAB — CBC
HCT: 39.9 % (ref 39.0–52.0)
Hemoglobin: 14 g/dL (ref 13.0–17.0)
MCH: 33 pg (ref 26.0–34.0)
MCHC: 35.1 g/dL (ref 30.0–36.0)
MCV: 94.1 fL (ref 80.0–100.0)
Platelets: 161 10*3/uL (ref 150–400)
RBC: 4.24 MIL/uL (ref 4.22–5.81)
RDW: 12.6 % (ref 11.5–15.5)
WBC: 10.1 10*3/uL (ref 4.0–10.5)
nRBC: 0 % (ref 0.0–0.2)

## 2022-05-24 LAB — TROPONIN I (HIGH SENSITIVITY): Troponin I (High Sensitivity): 4 ng/L (ref <18)

## 2022-05-24 LAB — HIV ANTIBODY (ROUTINE TESTING W REFLEX): HIV Screen 4th Generation wRfx: NONREACTIVE

## 2022-05-24 LAB — CBG MONITORING, ED
Glucose-Capillary: 186 mg/dL — ABNORMAL HIGH (ref 70–99)
Glucose-Capillary: 170 mg/dL — ABNORMAL HIGH (ref 70–99)
Glucose-Capillary: 131 mg/dL — ABNORMAL HIGH (ref 70–99)

## 2022-05-24 LAB — GLUCOSE, CAPILLARY
Glucose-Capillary: 137 mg/dL — ABNORMAL HIGH (ref 70–99)
Glucose-Capillary: 204 mg/dL — ABNORMAL HIGH (ref 70–99)

## 2022-05-24 LAB — SARS CORONAVIRUS 2 BY RT PCR: SARS Coronavirus 2 by RT PCR: NEGATIVE

## 2022-05-24 MED ORDER — ATORVASTATIN CALCIUM 10 MG PO TABS
10.0000 mg | ORAL_TABLET | Freq: Every day | ORAL | Status: DC
  Administered 2022-05-24 – 2022-05-25 (×2): 10 mg via ORAL
  Filled 2022-05-24 (×2): qty 1

## 2022-05-24 MED ORDER — SODIUM CHLORIDE 0.9 % IV SOLN
3.0000 g | Freq: Three times a day (TID) | INTRAVENOUS | Status: DC
  Administered 2022-05-24 – 2022-05-25 (×5): 3 g via INTRAVENOUS
  Filled 2022-05-24 (×5): qty 8

## 2022-05-24 MED ORDER — INSULIN ASPART 100 UNIT/ML IJ SOLN
0.0000 [IU] | Freq: Three times a day (TID) | INTRAMUSCULAR | Status: DC
  Administered 2022-05-24: 1 [IU] via SUBCUTANEOUS
  Administered 2022-05-24 – 2022-05-25 (×2): 2 [IU] via SUBCUTANEOUS

## 2022-05-24 MED ORDER — ACETAMINOPHEN 650 MG RE SUPP: 650.0000 mg | Freq: Four times a day (QID) | RECTAL | Status: DC | PRN

## 2022-05-24 MED ORDER — ACETAMINOPHEN 325 MG PO TABS: 650.0000 mg | ORAL_TABLET | Freq: Four times a day (QID) | ORAL | Status: DC | PRN

## 2022-05-24 MED ORDER — HEPARIN SODIUM (PORCINE) 5000 UNIT/ML IJ SOLN
5000.0000 [IU] | Freq: Three times a day (TID) | INTRAMUSCULAR | Status: DC
  Administered 2022-05-24 – 2022-05-25 (×6): 5000 [IU] via SUBCUTANEOUS
  Filled 2022-05-24 (×6): qty 1

## 2022-05-24 MED ORDER — INSULIN ASPART 100 UNIT/ML IJ SOLN
0.0000 [IU] | Freq: Every day | INTRAMUSCULAR | Status: DC
  Administered 2022-05-24: 2 [IU] via SUBCUTANEOUS

## 2022-05-24 NOTE — Progress Notes (Signed)
BLE venous duplex has been completed.  Results can be found under chart review under CV PROC. 05/24/2022 2:20 PM Eilish Mcdaniel RVT, RDMS

## 2022-05-24 NOTE — Progress Notes (Addendum)
[facility]  PROGRESS NOTE  Patrick Woodard UKG:[id number] DOB: 12/02/1970 DOA: 05/23/2022 PCP: Patrick Shave, MD   Brief HPI:   51 year old male with medical history of COPD, diabetes mellitus type 2, hypertension, history of cerebral amyloid angiopathy, history of aortic dissection, s/p repair in 2014.  EMS was called for patient having sudden onset of chest pain also had right-sided weakness along with right hemianopsia and extinction.  Code stroke was activated.  Patient's symptom resolved in 10 minutes. In the ED CT head was negative for intracranial hemorrhage, CTA head and neck was negative for LVO.  Given the history of dissection a CT dissection study was done which showed stable aortic dissection.  CT results were discussed with CT surgeon Dr. Sharlett Woodard who felt there was no new dissection today and was safe to give tPA from his standpoint.  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  Neurology did not give TNK is due to more than 10 microhemorrhages on SWI imaging with reported history of cerebral amyloid angiopathy.  D-dimer was elevated 3.55.    Subjective   Patient seen and examined, denies any weakness.  No chest pain or shortness of breath.  Still requiring oxygen 2 L/min.   Assessment/Plan:    Acute CVA -Presented with right-sided weakness along with right hemianopsia and extinction -CT head unremarkable; CTA head and neck negative for LVO -Brain MRI showed left hemisphere watershed distribution infarct -Neurology consulted; no TNKase given due to more than 10 microhemorrhages on SWI imaging with reported history of cerebral amyloid angiopathy -Echocardiogram pending -Follow hemoglobin A1c, 7.6 lipid panel -Started on aspirin 81 mg daily along with Plavix 75 mg daily for 21 days followed by aspirin alone -Permissive hypertension for first 24 hours, less than 220/110; and home medications -Strict bedrest for 48 hours and IV fluids at 125 cc/h for 48  hours -PT/OT, speech therapy; frequent neurochecks  Acute hypoxemic respiratory failure/chest pain -Patient experienced right-sided chest pain after finishing dinner at restaurant -Troponin negative -CT dissection study showed stable aortic dissection; results discussed with CT surgeon who did not feel that there was no dissection -Patient was hypoxemic on presentation with O2 sats in 80s after he received Ativan and vomited multiple times in the MRI scanner -D-dimer is positive -No evidence of PE on the CTA chest Patient empirically started on Unasyn for-concern for aspiration -Continue supplemental oxygen -Troponin level 3, 4  Elevated D-dimer -D-dimer elevated at 3.55 -Venous duplex of lower extremities ordered -We will follow results  Emesis -Occurred after patient received Ativan -No nausea vomiting at this time -Continue symptomatic management  Diabetes mellitus type 2 -Started on sliding scale insulin with NovoLog; CBG well controlled -Hemoglobin A1c 7.6  COPD -Stable; no acute exacerbation  Hypertension -Allow permissive hypertension  Hyperlipidemia -Continue Lipitor   Medications     aspirin  81 mg Oral Daily   Or   aspirin  300 mg Rectal Daily   atorvastatin  10 mg Oral Daily   clopidogrel  75 mg Oral Daily   heparin  5,000 Units Subcutaneous Q8H   insulin aspart  0-5 Units Subcutaneous QHS   insulin aspart  0-9 Units Subcutaneous TID WC     Data Reviewed:   CBG:  Recent Labs  Lab 05/23/22 2031 05/24/22 0303 05/24/22 0821  GLUCAP 176* 186* 170*    SpO2: 95 % O2 Flow Rate (L/min): 2 L/min    Vitals:   05/24/22 0445 05/24/22 0545 05/24/22 0615 05/24/22 0715  BP: (!) 151/70 126/74  115/76 132/62  Pulse: 90 81 77 83  Resp: 19 (!) 26 (!) 22 (!) 25  Temp:    98.4 F (36.9 C)  TempSrc:    Oral  SpO2: 99% 97% 97% 95%  Weight:      Height:          Data Reviewed:  Basic Metabolic Panel: Recent Labs  Lab 05/23/22 2034  05/23/22 2042 05/24/22 0328  NA 136 139 136  K 4.0 4.1 4.2  CL 102 101 104  CO2 24  --  23  GLUCOSE 231* 229* 172*  BUN 16 17 12   CREATININE 1.04 0.90 1.01  CALCIUM 8.9  --  8.5*    CBC: Recent Labs  Lab 05/23/22 2034 05/23/22 2042 05/24/22 0328  WBC 9.3  --  10.1  NEUTROABS 5.8  --   --   HGB 14.6 14.3 14.0  HCT 40.3 42.0 39.9  MCV 91.8  --  94.1  PLT 178  --  161    LFT Recent Labs  Lab 05/23/22 2034  AST 22  ALT 22  ALKPHOS 109  BILITOT 0.7  PROT 6.8  ALBUMIN 3.9     Antibiotics: Anti-infectives (From admission, onward)    Start     Dose/Rate Route Frequency Ordered Stop   05/24/22 0100  Ampicillin-Sulbactam (UNASYN) 3 g in sodium chloride 0.9 % 100 mL IVPB        3 g 200 mL/hr over 30 Minutes Intravenous Every 8 hours 05/24/22 0049          DVT prophylaxis: Heparin  Code Status: Full code  Family Communication:    CONSULTS neurology   Objective    Physical Examination:  General-appears in no acute distress Heart-S1-S2, regular, no murmur auscultated Lungs-clear to auscultation bilaterally, no wheezing or crackles auscultated Abdomen-soft, nontender, no organomegaly Extremities-no edema in the lower extremities Neuro-alert, oriented x3, no focal deficit noted   Status is: Inpatient:             Patrick Woodard   [facility] If 7PM-7AM, please contact night-coverage at [email or web address], Office  [phone]   05/24/2022, 8:48 AM  LOS: 1 day

## 2022-05-24 NOTE — Progress Notes (Addendum)
STROKE TEAM PROGRESS NOTE   INTERVAL HISTORY Patient is seen resting comfortably, in no acute distress. Reports that his right sided weakness and dysarthria have resolved. Notes that CP only lasted 2-3 hours after onset, denies both right now. Denies any changes in vision. Continues to endorse headache, otherwise no other acute complaints. Patient mentions that his CPAP does little to treat his daytime sleepiness, was prescribed armodafinil but that it doesn't seem to help. Patient confirms Hx of afib with ablation approx 10 years ago, and that he still has palpitations 1-2 times a year.   Vitals:   05/24/22 1200 05/24/22 1300 05/24/22 1415 05/24/22 1421  BP: (!) 147/84 134/69 128/80   Pulse: 79 63 71   Resp: 13 18 (!) 21   Temp:   97.9 F (36.6 C) 97.8 F (36.6 C)  TempSrc:   Oral Oral  SpO2: 97% 97% 98%   Weight:      Height:       CBC:  Recent Labs  Lab 05/23/22 2034 05/23/22 2042 05/24/22 0328  WBC 9.3  --  10.1  NEUTROABS 5.8  --   --   HGB 14.6 14.3 14.0  HCT 40.3 42.0 39.9  MCV 91.8  --  94.1  PLT 178  --  [id number]   Basic Metabolic Panel:  Recent Labs  Lab 05/23/22 2034 05/23/22 2042 05/24/22 0328  NA 136 139 136  K 4.0 4.1 4.2  CL 102 101 104  CO2 24  --  23  GLUCOSE 231* 229* 172*  BUN 16 17 12   CREATININE 1.04 0.90 1.01  CALCIUM 8.9  --  8.5*   Lipid Panel: No results for input(s): "CHOL", "TRIG", "HDL", "CHOLHDL", "VLDL", "LDLCALC" in the last 168 hours. HgbA1c:  Recent Labs  Lab 05/24/22 0327  HGBA1C 7.6*   Urine Drug Screen: No results for input(s): "LABOPIA", "COCAINSCRNUR", "LABBENZ", "AMPHETMU", "THCU", "LABBARB" in the last 168 hours.  Alcohol Level  Recent Labs  Lab 05/23/22 2034  ETH <10    IMAGING past 24 hours VAS [place] LOWER EXTREMITY VENOUS (DVT)  Result Date: 05/24/2022  Lower Venous DVT Study Patient Name:  Patrick Woodard  Date of Exam:   05/24/2022 Medical Rec #: [id number]   Accession #:    [id number] Date of Birth: 1971-07-03   Patient  Gender: M Patient Age:   51 years Exam Location:  [facility] Procedure:      VAS [place] LOWER EXTREMITY VENOUS (DVT) Referring Phys: Wandra Feinstein RATHORE --------------------------------------------------------------------------------  Indications: Stroke, and Positive D-dimer (3.55).  Limitations: Poor ultrasound/tissue interface. Comparison Study: No previous exams Performing Technologist: Jody Hill RVT, RDMS  Examination Guidelines: A complete evaluation includes B-mode imaging, spectral Doppler, color Doppler, and power Doppler as needed of all accessible portions of each vessel. Bilateral testing is considered an integral part of a complete examination. Limited examinations for reoccurring indications may be performed as noted. The reflux portion of the exam is performed with the patient in reverse Trendelenburg.  +---------+---------------+---------+-----------+----------+--------------+ RIGHT    CompressibilityPhasicitySpontaneityPropertiesThrombus Aging +---------+---------------+---------+-----------+----------+--------------+ CFV      Full           Yes      Yes                                 +---------+---------------+---------+-----------+----------+--------------+ SFJ      Full                                                        +---------+---------------+---------+-----------+----------+--------------+  FV Prox  Full           Yes      Yes                                 +---------+---------------+---------+-----------+----------+--------------+ FV Mid   Full           Yes      Yes                                 +---------+---------------+---------+-----------+----------+--------------+ FV DistalFull           Yes      Yes                                 +---------+---------------+---------+-----------+----------+--------------+ PFV      Full                                                         +---------+---------------+---------+-----------+----------+--------------+ POP      Full           Yes      Yes                                 +---------+---------------+---------+-----------+----------+--------------+ PTV      Full                                                        +---------+---------------+---------+-----------+----------+--------------+ PERO     Full                                                        +---------+---------------+---------+-----------+----------+--------------+   +---------+---------------+---------+-----------+----------+--------------+ LEFT     CompressibilityPhasicitySpontaneityPropertiesThrombus Aging +---------+---------------+---------+-----------+----------+--------------+ CFV      Full           Yes      Yes                                 +---------+---------------+---------+-----------+----------+--------------+ SFJ      Full                                                        +---------+---------------+---------+-----------+----------+--------------+ FV Prox  Full           Yes      Yes                                 +---------+---------------+---------+-----------+----------+--------------+ FV Mid   Full  Yes      Yes                                 +---------+---------------+---------+-----------+----------+--------------+ FV DistalFull           Yes      Yes                                 +---------+---------------+---------+-----------+----------+--------------+ PFV      Full                                                        +---------+---------------+---------+-----------+----------+--------------+ POP      Full           Yes      Yes                                 +---------+---------------+---------+-----------+----------+--------------+ PTV      Full                                                         +---------+---------------+---------+-----------+----------+--------------+ PERO     Full                                                        +---------+---------------+---------+-----------+----------+--------------+     Summary: BILATERAL: - No evidence of deep vein thrombosis seen in the lower extremities, bilaterally. -No evidence of popliteal cyst, bilaterally.   *See table(s) above for measurements and observations.    Preliminary    DG Chest Portable 1 View  Result Date: 05/24/2022 CLINICAL DATA:  Question aspiration on MRI.  Chest pain. EXAM: PORTABLE CHEST 1 VIEW COMPARISON:  Chest x-ray 09/19/2019. CT chest abdomen and pelvis 05/23/2022. FINDINGS: Aorta is ectatic as seen on the prior study. Cardiac silhouette is enlarged, unchanged. Median sternotomy wires are again seen. Lung volumes are low likely accentuating central pulmonary vascularity. The lungs are clear. There is no pleural effusion or pneumothorax. No acute fractures are seen. IMPRESSION: No acute cardiopulmonary process. Stable cardiomegaly. Electronically Signed   By: Ronney Asters M.D.   On: 05/24/2022 00:04   MR BRAIN WO CONTRAST  Result Date: 05/23/2022 CLINICAL DATA:  Stroke follow-up.  Diagnosis of amyloidosis. EXAM: MRI HEAD WITHOUT CONTRAST TECHNIQUE: Multiplanar, multiecho pulse sequences of the brain and surrounding structures were obtained without intravenous contrast. COMPARISON:  04/09/2018 brain MRI FINDINGS: Brain: Susceptibility weighted imaging shows remote hemorrhage along the medial aspect of the left thalamus. Additionally, there are scattered chronic microhemorrhages within both hemispheres in a peripheral, lobar distribution. Compared to 04/19/2018, there are a few more hemorrhages now visible (left temporal lobe, cerebellum) but it is not possible to know whether these are truly new hemorrhages or due to increased sensitivity of susceptibility weighted imaging versus the GRE sequence on the older  study.  There is multifocal periventricular white matter hyperintensity, most often a result of chronic microvascular ischemia. No hydrocephalus or volume loss. Old right occipital infarct. Vascular: Major flow voids are preserved. Skull and upper cervical spine: Normal calvarium and skull base. Visualized upper cervical spine and soft tissues are normal. Sinuses/Orbits:No paranasal sinus fluid levels or advanced mucosal thickening. No mastoid or middle ear effusion. Normal orbits. IMPRESSION: 1. No acute intracranial abnormality. 2. Scattered chronic microhemorrhages in a peripheral, lobar distribution, which is compatible with amyloid angiopathy. 3. 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 Electronically Signed   By: Ulyses Jarred M.D.   On: 05/23/2022 23:22   MR BRAIN WO CONTRAST  Result Date: 05/23/2022 CLINICAL DATA:  Acute neurologic deficit EXAM: MRI HEAD WITHOUT CONTRAST TECHNIQUE: Multiplanar, multiecho pulse sequences of the brain and surrounding structures were obtained without intravenous contrast. COMPARISON:  None Available. FINDINGS: Only diffusion-weighted imaging was performed. This shows areas of abnormal diffusion restriction within the left hemisphere in a watershed distribution, involving the left frontal and parietal white matter and subcortical areas of the left occipital lobe. No midline shift or other mass effect. IMPRESSION: 1. Diffusion-weighted imaging showing left hemisphere watershed distribution infarcts. Electronically Signed   By: Ulyses Jarred M.D.   On: 05/23/2022 22:03   CT Angio Chest/Abd/Pel for Dissection W and/or W/WO  Result Date: 05/23/2022 CLINICAL DATA:  Chest pain.  Known aortic dissection. EXAM: CT ANGIOGRAPHY CHEST, ABDOMEN AND PELVIS TECHNIQUE: Non-contrast CT of the chest was initially obtained. Multidetector CT imaging through the chest, abdomen and pelvis was performed using the standard protocol during bolus administration of intravenous contrast. Multiplanar reconstructed  images and MIPs were obtained and reviewed to evaluate the vascular anatomy. RADIATION DOSE REDUCTION: This exam was performed according to the departmental dose-optimization program which includes automated exposure control, adjustment of the mA and/or kV according to patient size and/or use of iterative reconstruction technique. CONTRAST:  24mL OMNIPAQUE IOHEXOL 350 MG/ML SOLN COMPARISON:  04/18/2018 FINDINGS: CTA CHEST FINDINGS Cardiovascular: --Heart: The heart size is normal.  There is nopericardial effusion. 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 Prior ascending aortic repair. The dissection extends into the innominate artery and the proximal right common carotid artery, unchanged. The other arch vessels are normal. --Pulmonary Arteries: Contrast timing is optimized for preferential opacification of the aorta. Within that limitation, normal central pulmonary arteries. Mediastinum/Nodes: No mediastinal, hilar or axillary lymphadenopathy. The visualized thyroid and thoracic esophageal course are unremarkable. Lungs/Pleura: No pulmonary nodules or masses. No pleural effusion or pneumothorax. No focal airspace consolidation. No focal pleural abnormality. Musculoskeletal: No chest wall abnormality. No acute osseous findings. Review of the MIP images confirms the above findings. CTA ABDOMEN AND PELVIS FINDINGS VASCULAR Aorta: Dissection extends to the level of the renal arteries. There is mild aortic atherosclerosis. Celiac: Arises from the true lumen.  Patent SMA: Arises from the true lumen. Renals: Arise below the level of the dissection flap. No aneurysm, dissection, stenosis or evidence of fibromuscular dysplasia. IMA: Patent without abnormality. Inflow: No aneurysm, stenosis or dissection. Veins: Normal course and caliber of the major veins. Assessment is otherwise limited by the arterial dominant contrast phase.  Review of the MIP images confirms the above findings. NON-VASCULAR Hepatobiliary: Normal hepatic contours and density. No visible biliary dilatation. Normal gallbladder. Pancreas: Normal contours without ductal dilatation. No peripancreatic fluid collection. Spleen: Normal arterial phase splenic enhancement pattern. Adrenals/Urinary Tract: --Adrenal glands: Normal. --  Right kidney/ureter: No hydronephrosis or perinephric stranding. No nephrolithiasis. No obstructing ureteral stones. --Left kidney/ureter: No hydronephrosis or perinephric stranding. No nephrolithiasis. No obstructing ureteral stones. --Urinary bladder: Unremarkable. Stomach/Bowel: --Stomach/Duodenum: No hiatal hernia or other gastric abnormality. Normal duodenal course and caliber. --Small bowel: No dilatation or inflammation. --Colon: No focal abnormality. --Appendix: Normal. Lymphatic:  No abdominal or pelvic lymphadenopathy. Reproductive: Prostate is enlarged. Musculoskeletal. No bony spinal canal stenosis or focal osseous abnormality. Other: None. Review of the MIP images confirms the above findings. IMPRESSION: 1. Unchanged appearance of type a aortic dissection extending from proximal to the innominate artery to the level of the renal arteries, status post ascending aortic repair. 2. No new dissection or other acute abnormality of the chest, abdomen or pelvis. 3. Aortic Atherosclerosis (ICD10-I70.0). These results were called by telephone at the time of interpretation on 05/23/2022 at 8:53 pm to provider Dr. Wyvonnia Dusky, who verbally acknowledged these results. Electronically Signed   By: Ulyses Jarred M.D.   On: 05/23/2022 21:13   CT HEAD CODE STROKE WO CONTRAST  Result Date: 05/23/2022 CLINICAL DATA:  Stroke-like symptoms EXAM: CT HEAD WITHOUT CONTRAST CT ANGIOGRAPHY OF THE HEAD AND NECK TECHNIQUE: Contiguous axial images were obtained from the base of the skull through the vertex without intravenous contrast. Multidetector CT imaging of the  head and neck was performed using the standard protocol during bolus administration of intravenous contrast. Multiplanar CT image reconstructions and MIPs were obtained to evaluate the vascular anatomy. Carotid stenosis measurements (when applicable) are obtained utilizing NASCET criteria, using the distal internal carotid diameter as the denominator. RADIATION DOSE REDUCTION: This exam was performed according to the departmental dose-optimization program which includes automated exposure control, adjustment of the mA and/or kV according to patient size and/or use of iterative reconstruction technique. CONTRAST:  Ten 90 little burr COMPARISON:  None Available. FINDINGS: CT HEAD FINDINGS Brain: There is no mass, hemorrhage or extra-axial collection. The size and configuration of the ventricles and extra-axial CSF spaces are normal. The brain parenchyma is normal, without evidence of acute or chronic infarction. Vascular: No abnormal hyperdensity of the major intracranial arteries or dural venous sinuses. No intracranial atherosclerosis. Skull: The visualized skull base, calvarium and extracranial soft tissues are normal. Sinuses/Orbits: No fluid levels or advanced mucosal thickening of the visualized paranasal sinuses. No mastoid or middle ear effusion. The orbits are normal. ASPECTS ([place] Stroke Program Early CT Score) - Ganglionic level infarction (caudate, lentiform nuclei, internal capsule, insula, M1-M3 cortex): 7 - Supraganglionic infarction (M4-M6 cortex): 3 Total score (0-10 with 10 being normal): 10 CTA NECK FINDINGS Aortic dissection is evaluated completely on concomitant CTA chest. RIGHT CAROTID SYSTEM: Skeleton: Normal. Other neck: Unremarkable. LEFT CAROTID SYSTEM: Normal without aneurysm, dissection or stenosis. VERTEBRAL ARTERIES: Left dominant configuration. Both origins are clearly patent. There is no dissection, occlusion or flow-limiting stenosis to the skull base (V1-V3 segments). CTA HEAD  FINDINGS POSTERIOR CIRCULATION: --Vertebral arteries: Normal V4 segments. --Inferior cerebellar arteries: Normal. --Basilar artery: Normal. --Superior cerebellar arteries: Normal. --Posterior cerebral arteries (PCA): Normal. ANTERIOR CIRCULATION: --Intracranial internal carotid arteries: Normal. --Anterior cerebral arteries (ACA): Normal. Both A1 segments are present. Patent anterior communicating artery (a-comm). --Middle cerebral arteries (MCA): Normal. VENOUS SINUSES: As permitted by contrast timing, patent. ANATOMIC VARIANTS: None Review of the MIP images confirms the above findings. IMPRESSION: 1. No intracranial hemorrhage or mass lesion. ASPECTS is 10. 2. No emergent large vessel occlusion or high-grade stenosis of the intracranial arteries. 3. Aortic dissection, evaluated completely on concomitant CTA chest. These  results were called by telephone at the time of interpretation on 05/23/2022 at 8:57 pm to providers [facility] ; [facility] , who verbally acknowledged these results. Electronically Signed   By: Ulyses Jarred M.D.   On: 05/23/2022 20:58   CT ANGIO HEAD NECK W WO CM (CODE STROKE)  Result Date: 05/23/2022 CLINICAL DATA:  Stroke-like symptoms EXAM: CT HEAD WITHOUT CONTRAST CT ANGIOGRAPHY OF THE HEAD AND NECK TECHNIQUE: Contiguous axial images were obtained from the base of the skull through the vertex without intravenous contrast. Multidetector CT imaging of the head and neck was performed using the standard protocol during bolus administration of intravenous contrast. Multiplanar CT image reconstructions and MIPs were obtained to evaluate the vascular anatomy. Carotid stenosis measurements (when applicable) are obtained utilizing NASCET criteria, using the distal internal carotid diameter as the denominator. RADIATION DOSE REDUCTION: This exam was performed according to the departmental dose-optimization program which includes automated exposure control, adjustment of the mA and/or kV  according to patient size and/or use of iterative reconstruction technique. CONTRAST:  Ten 90 little burr COMPARISON:  None Available. FINDINGS: CT HEAD FINDINGS Brain: There is no mass, hemorrhage or extra-axial collection. The size and configuration of the ventricles and extra-axial CSF spaces are normal. The brain parenchyma is normal, without evidence of acute or chronic infarction. Vascular: No abnormal hyperdensity of the major intracranial arteries or dural venous sinuses. No intracranial atherosclerosis. Skull: The visualized skull base, calvarium and extracranial soft tissues are normal. Sinuses/Orbits: No fluid levels or advanced mucosal thickening of the visualized paranasal sinuses. No mastoid or middle ear effusion. The orbits are normal. ASPECTS ([place] Stroke Program Early CT Score) - Ganglionic level infarction (caudate, lentiform nuclei, internal capsule, insula, M1-M3 cortex): 7 - Supraganglionic infarction (M4-M6 cortex): 3 Total score (0-10 with 10 being normal): 10 CTA NECK FINDINGS Aortic dissection is evaluated completely on concomitant CTA chest. RIGHT CAROTID SYSTEM: Skeleton: Normal. Other neck: Unremarkable. LEFT CAROTID SYSTEM: Normal without aneurysm, dissection or stenosis. VERTEBRAL ARTERIES: Left dominant configuration. Both origins are clearly patent. There is no dissection, occlusion or flow-limiting stenosis to the skull base (V1-V3 segments). CTA HEAD FINDINGS POSTERIOR CIRCULATION: --Vertebral arteries: Normal V4 segments. --Inferior cerebellar arteries: Normal. --Basilar artery: Normal. --Superior cerebellar arteries: Normal. --Posterior cerebral arteries (PCA): Normal. ANTERIOR CIRCULATION: --Intracranial internal carotid arteries: Normal. --Anterior cerebral arteries (ACA): Normal. Both A1 segments are present. Patent anterior communicating artery (a-comm). --Middle cerebral arteries (MCA): Normal. VENOUS SINUSES: As permitted by contrast timing, patent. ANATOMIC VARIANTS:  None Review of the MIP images confirms the above findings. IMPRESSION: 1. No intracranial hemorrhage or mass lesion. ASPECTS is 10. 2. No emergent large vessel occlusion or high-grade stenosis of the intracranial arteries. 3. Aortic dissection, evaluated completely on concomitant CTA chest. These results were called by telephone at the time of interpretation on 05/23/2022 at 8:57 pm to providers [facility] ; [facility] , who verbally acknowledged these results. Electronically Signed   By: Ulyses Jarred M.D.   On: 05/23/2022 20:58    --PHYSICAL EXAM-- Constitutional: Well nourished, middle aged man, in no acute distress HEENT: Normocephalic, Normal conjunctiva, anicteric. Hearing grossly intact. No nasal discharge. Neck: Neck is supple. No masses or thyromegaly. Resp: Respirations are non-labored. No wheezing. Skin: Warm. No rashes or ulcers.   Neuro  Mental Status AAOx3  Language: Fluency, Comprehension, and Naming intact  Cranial Nerves II: Nor abnormalities in visual fields. III, IV, VI: EOM intact, no gaze preference or deviation, no nystagmus  V: Normal sensation  in V1, V2, and V3 segments bilaterally  VII: Facial symmetry at rest and during various facial expressions VIII: Normal hearing to speech  IX, X: Normal palatal elevation, no uvular deviation  XI: 5/5 head turn and 5/5 shoulder shrug bilaterally XII: Symmetric tongue movement, tongue is midline without atrophy or fasciculations.   Motor Strength R UE: 5/5 L UE: 5/5 R LE: 5/5 L LE: 5/5  Sensory  Intact sensation of UE and LE bilaterally.   Cerebellar: FNF: WNL Heel-to-Shin Test: WNL  ASSESSMENT/PLAN Mr. Patrick Woodard is a 52 y.o. male with history of  aortic dissection, COPD, DM2, HTN, ?hx of cerebral amyloid angiopathy vs hypertension, hx of aortic dissection s/p repair in 2014 who was driving back from restaurant when he reports sudden onset chest pain. EMS called and he was noted to have profound R  sided weakness along with R hemianopsia and extinction. CTH negative for ICH, no LVO on CT angio head and neck. Tnkase was considered and STAT MRI Was obtained which demonstrated watershed infarct in L ACA/MCA/PCA distribution without any LVO. MRI also shows more than 10 microhemorrhages on SWI images and thus tnkase was not offered.  Stroke - scattered acute/subacute left hemisphere infarcts in watershed distribution, concerning for cardioembolic source in the setting of prior afib hx s/p ablation Code Stroke CT head. No acute abnormality. Atrophy. ASPECTS 10.    CTA head & neck  - Stable Aortic dissection extending from proximal to the innominate artery to the level of the renal arteries MRI  Scattered punctate infarct of L hemisphere concern w/ watershed distribution.  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 Ermalene Postin and MRI showed several CMBs 08/2019 episode of confusion lasted 54min without recall the event, Dr. Ermalene Postin concerning for "amyloid spells" from [place] to refer to [facility] for further diagnosis but did not pursue This admission MRI showed Scattered chronic microhemorrhages (10+) in a peripheral, lobar distribution, which is compatible with amyloid angiopathy. 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  Exact etiology uncertain, CAA vs. hypertensive  Hx of Atrial Fibrillation s/p ablation S/p ablation in 2013 Normal sinus rhythm right now Pt complains of heart palpitation average once a month Given current embolic stroke, PAF is possible - may consider loop recorder.  If deemed not AC candidate, may consider watchman device. Will discuss with EP.   Hypertension Home meds:  Lopressor  Stable now Long-term BP goal  normotensive  Hyperlipidemia Lipid panel -pending Add Lipitor 40 mg  Continue statin at discharge  Diabetes type II Uncontrolled Home meds:  none HgbA1c 7.6, goal < 7.0 CBGs SSI Close PCP follow up  Aortic Dissection s/p repair 2014 CTA chest - Unchanged appearance of type a aortic dissection extending from proximal to the innominate artery to the level of the renal arteries, status post ascending aortic repair. Home meds: Lopressor 10 mg Stable BP and HR today, will hold until out of permissive HTN  Other Stroke Risk Factors  Obstructive sleep apnea, on CPAP at home - follows with Dr. Brett Fairy   Other Active Problems Concern for aspiration - Unasyn day 2/7  Hospital day # 1  Christene Slates, MD PGY-1   ATTENDING NOTE: I reviewed above note and agree with the assessment and plan. Pt was seen  and examined.   51 year old male with history of aortic dissection s/p repair 07/2013, diabetes, hypertension, mild OSA, cerebral microbleeds, A-fib status post ablation 2013 admitted for chest pain, right-sided weakness, right hemianopia and right neglect.  Chest pain, right-sided weakness and neglect resolved, however right hemianopia persist EEG.  CT no acute abnormality.  CT head and neck no LVO.  MRI showed left hemisphere scattered small infarct in the watershed distribution.  Several (10+) cerebral microbleeds and chronic small left thalamic ICH present.  No TNK given due to 10+ CMBs and old thalamic ICH.  2D pending, LE venous Doppler no DVT.  A1c 7.6, LDL pending, UDS pending.  Creatinine 1.01.  Patient has a history of headache in 04/2018, MRI showed several cerebral microbleeds.  08/2019 episode of confusion, neurologist Dr. Ermalene Postin concerning for amyloid spells.  However no official diagnosis of amyloid angiopathy.  On exam, patient lying in bed, no family at bedside.  AOx4, no aphasia, follows some commands, neurologically intact except right shoulder strength decreased  strength.  Etiology for patient stroke likely embolic pattern concerning for PAF not on [facility] although status post ablation in 2013.  Patient does have intermittent heart palpitation but infrequent.  Can consider AC but patient does have multiple CMBs/cannot rule out amyloid angiopathy.  If so, may consider Watchman device.  Another option would be loop recorder to document recurrent A-fib.  Will discuss with EP.  Currently on aspirin and Plavix DAPT.  Continue Lipitor.  PT/OT pending.  We will follow.  For detailed assessment and plan, please refer to above/below as I have made changes wherever appropriate.   Rosalin Hawking, MD PhD Stroke Neurology 05/24/2022 5:47 PM  Patient condition very complicated with multiple medical conditions making medical decision difficult. I spent extensive time with the patient, more than 50% of which was spent in counseling and coordination of care, reviewing test results, images and medication, and discussing the diagnosis, treatment plan and potential prognosis. This patient's care requiresreview of multiple databases, neurological assessment, discussion with family, other specialists and medical decision making of high complexity.      To contact Stroke Continuity provider, please refer to [email or web address] After hours, contact General Neurology

## 2022-05-25 ENCOUNTER — Inpatient Hospital Stay (HOSPITAL_COMMUNITY): Payer: BC Managed Care – PPO

## 2022-05-25 ENCOUNTER — Telehealth: Payer: Self-pay

## 2022-05-25 DIAGNOSIS — I6389 Other cerebral infarction: Secondary | ICD-10-CM

## 2022-05-25 DIAGNOSIS — I639 Cerebral infarction, unspecified: Secondary | ICD-10-CM | POA: Diagnosis not present

## 2022-05-25 DIAGNOSIS — J9601 Acute respiratory failure with hypoxia: Secondary | ICD-10-CM | POA: Diagnosis not present

## 2022-05-25 LAB — ECHOCARDIOGRAM COMPLETE
AR max vel: 4.01 cm2
AV Area VTI: 4.08 cm2
AV Area mean vel: 3.77 cm2
AV Mean grad: 2 mmHg
AV Peak grad: 4.2 mmHg
Ao pk vel: 1.03 m/s
Area-P 1/2: 3.99 cm2
Calc EF: 43.1 %
Height: 72 in
MV M vel: 5.15 m/s
MV Peak grad: 106.1 mmHg
P 1/2 time: 701 msec
S' Lateral: 3.6 cm
Single Plane A2C EF: 37.8 %
Single Plane A4C EF: 47.8 %
Weight: 3440 oz

## 2022-05-25 LAB — GLUCOSE, CAPILLARY
Glucose-Capillary: 142 mg/dL — ABNORMAL HIGH (ref 70–99)
Glucose-Capillary: 179 mg/dL — ABNORMAL HIGH (ref 70–99)
Glucose-Capillary: 109 mg/dL — ABNORMAL HIGH (ref 70–99)

## 2022-05-25 LAB — LIPID PANEL
Cholesterol: 93 mg/dL (ref 0–200)
HDL: 23 mg/dL — ABNORMAL LOW (ref >40)
LDL Cholesterol: 40 mg/dL (ref 0–99)
Total CHOL/HDL Ratio: 4 RATIO
Triglycerides: 149 mg/dL (ref <150)
VLDL: 30 mg/dL (ref 0–40)

## 2022-05-25 MED ORDER — CLOPIDOGREL BISULFATE 75 MG PO TABS: 75.0000 mg | ORAL_TABLET | Freq: Every day | ORAL | 3 refills | Status: AC

## 2022-05-25 MED ORDER — AMOXICILLIN-POT CLAVULANATE 875-125 MG PO TABS: 1.0000 | ORAL_TABLET | Freq: Two times a day (BID) | ORAL | 0 refills | Status: DC

## 2022-05-25 MED ORDER — ASPIRIN 81 MG PO CHEW: 81.0000 mg | CHEWABLE_TABLET | Freq: Every day | ORAL | 3 refills | Status: AC

## 2022-05-25 NOTE — Evaluation (Signed)
Physical Therapy Evaluation Patient Details Name: Patrick Woodard MRN: [id number] DOB: 04/14/1971 Today's Date: 05/25/2022  History of Present Illness  51 yo male presents to [facility] on 9/16 with R arm weakness, R-sided neglect, headache. 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  MRI also shows more than 10 microhemorrhages on SWI images and thus tnkase was not offered. PMH includes COPD, DM2, HTN, ?hx of cerebral amyloid angiopathy vs hypertension, hx of aortic dissection s/p repair in 2014  Clinical Impression   Pt presents with min R-sided weakness, incoordination, and higher level balance deficits. Pt ambulated hallway distance at supervision level, requires increased time to tolerate challenge to balance and would struggle with higher level tasks based on current mobility. PT recommending OPPT to address strength, coordination, and balance deficits. PT to progress mobility as tolerated, and will continue to follow acutely.         Recommendations for follow up therapy are one component of a multi-disciplinary discharge planning process, led by the attending physician.  Recommendations may be updated based on patient status, additional functional criteria and insurance authorization.  Follow Up Recommendations Outpatient PT      Assistance Recommended at Discharge Intermittent Supervision/Assistance  Patient can return home with the following  A little help with walking and/or transfers    Equipment Recommendations None recommended by PT  Recommendations for Other Services       Functional Status Assessment Patient has had a recent decline in their functional status and demonstrates the ability to make significant improvements in function in a reasonable and predictable amount of time.     Precautions / Restrictions Precautions Precautions: Fall Restrictions Weight Bearing Restrictions: No      Mobility  Bed Mobility Overal bed mobility: Needs Assistance Bed  Mobility: Supine to Sit     Supine to sit: Supervision, HOB elevated     General bed mobility comments: very increased time and use of bedrails to perform, no physical assist    Transfers Overall transfer level: Needs assistance Equipment used: None Transfers: Sit to/from Stand Sit to Stand: Supervision           General transfer comment: for safety, use of SL support to rise    Ambulation/Gait Ambulation/Gait assistance: Supervision Gait Distance (Feet): 275 Feet Assistive device: None Gait Pattern/deviations: Step-through pattern, Decreased stride length, Wide base of support Gait velocity: decr     General Gait Details: decr R armswing with min flexion synergy noted, widened BOS with outtoeing bilat R>L  Stairs            Wheelchair Mobility    Modified Rankin (Stroke Patients Only)       Balance Overall balance assessment: Needs assistance Sitting-balance support: No upper extremity supported, Feet supported Sitting balance-Leahy Scale: Good     Standing balance support: No upper extremity supported, During functional activity Standing balance-Leahy Scale: Fair                 High Level Balance Comments: Increased time to perform bend to pick up object, step over object, directional change             Pertinent Vitals/Pain Pain Assessment Pain Assessment: No/denies pain    Home Living Family/patient expects to be discharged to:: Private residence Living Arrangements: Spouse/significant other Available Help at Discharge: Family;Available PRN/intermittently Type of Home: House Home Access: Stairs to enter   [employer] of Steps: 2   Home Layout: One level Home Equipment: None Additional Comments: wife can work  from home    Prior Function Prior Level of Function : Independent/Modified Independent;Working/employed;Driving               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     [employer]   Dominant Hand: Left    Extremity/Trunk Assessment   Upper Extremity Assessment Upper Extremity Assessment: Defer to OT evaluation RUE Deficits / Details: strength WFL, denies sensory changes, thumb to finger movements similar to that on L, pt noted to posture R wrist when ambulating with very minimal arm swing, used R UE effectively when donning socks    Lower Extremity Assessment Lower Extremity Assessment: RLE deficits/detail RLE Deficits / Details: grossly 5/5 knee flex/ext, 3+/5 hip flexion/ext/abd.  + rebound during MMT testing and min incoordination during mobility.    Cervical / Trunk Assessment Cervical / Trunk Assessment: Normal  Communication   Communication: No difficulties  Cognition Arousal/Alertness: Awake/alert Behavior During Therapy: Flat affect Overall Cognitive Status: Impaired/Different from baseline Area of Impairment: Problem solving, Safety/judgement                         Safety/Judgement: Decreased awareness of deficits, Decreased awareness of safety   Problem Solving: Slow processing General Comments: Pt with some lack of insight into deficits, pt states "yes" he is at baseline but wife states he is not        General Comments      Exercises Other Exercises Other Exercises: Reviewed BE FAST principles   Assessment/Plan    PT Assessment All further PT needs can be met in the next venue of care  PT Problem List Decreased strength;Decreased mobility;Decreased balance;Decreased knowledge of use of DME;Decreased activity tolerance;Decreased safety awareness       PT Treatment Interventions Therapeutic activities;Gait training;Therapeutic exercise;Patient/family education;Stair training;Balance training;Functional mobility training;Neuromuscular re-education    PT Goals (Current goals can be found in the Care Plan section)  Acute Rehab PT Goals PT Goal Formulation: With patient Time For Goal Achievement: 06/08/22 Potential  to Achieve Goals: Good    Frequency  (n/a)     Co-evaluation               AM-PAC PT "6 Clicks" Mobility  Outcome Measure Help needed turning from your back to your side while in a flat bed without using bedrails?: A Little Help needed moving from lying on your back to sitting on the side of a flat bed without using bedrails?: A Little Help needed moving to and from a bed to a chair (including a wheelchair)?: A Little Help needed standing up from a chair using your arms (e.g., wheelchair or bedside chair)?: A Little Help needed to walk in hospital room?: A Little Help needed climbing 3-5 steps with a railing? : A Little 6 Click Score: 18    End of Session Equipment Utilized During Treatment: Gait belt Activity Tolerance: Patient tolerated treatment well Patient left: in bed;with call bell/phone within reach;with family/visitor present Nurse Communication: Mobility status PT Visit Diagnosis: Other abnormalities of gait and mobility (R26.89)    Time: 8563-1497 PT Time Calculation (min) (ACUTE ONLY): 15 min   Charges:   PT Evaluation $PT Eval Low Complexity: 1 Low         Ayse Mccartin S, PT DPT Acute Rehabilitation Services Pager [phone]  Office [phone]   Roxine Caddy E Ruffin Pyo 05/25/2022, 4:21 PM

## 2022-05-25 NOTE — Progress Notes (Addendum)
STROKE TEAM PROGRESS NOTE   INTERVAL HISTORY Patient evaluated at bedside this morning. Reports feeling well, no acute complaints. Discussed treatment goals with patient, has chosen to move forward with watchman device placement. Wife is present, supports patient's decision.    Vitals:   05/25/22 0000 05/25/22 0400 05/25/22 0800 05/25/22 1138  BP: 129/68 (!) 113/50 130/61 (!) 141/73  Pulse: (!) 59 63 70 64  Resp: (!) 23 (!) 22 16 15   Temp: (!) 97.3 F (36.3 C) 97.6 F (36.4 C) 97.9 F (36.6 C) (!) 97.5 F (36.4 C)  TempSrc: Oral Oral Oral Oral  SpO2: 94% 96% 97% 97%  Weight:      Height:       CBC:  Recent Labs  Lab 05/23/22 2034 05/23/22 2042 05/24/22 0328  WBC 9.3  --  10.1  NEUTROABS 5.8  --   --   HGB 14.6 14.3 14.0  HCT 40.3 42.0 39.9  MCV 91.8  --  94.1  PLT 178  --  [id number]   Basic Metabolic Panel:  Recent Labs  Lab 05/23/22 2034 05/23/22 2042 05/24/22 0328  NA 136 139 136  K 4.0 4.1 4.2  CL 102 101 104  CO2 24  --  23  GLUCOSE 231* 229* 172*  BUN 16 17 12   CREATININE 1.04 0.90 1.01  CALCIUM 8.9  --  8.5*   Lipid Panel:  Recent Labs  Lab 05/25/22 0351  CHOL 93  TRIG 149  HDL 23*  CHOLHDL 4.0  VLDL 30  LDLCALC 40   HgbA1c:  Recent Labs  Lab 05/24/22 0327  HGBA1C 7.6*   Urine Drug Screen:  Recent Labs  Lab 05/24/22 1456  LABOPIA NONE DETECTED  COCAINSCRNUR NONE DETECTED  LABBENZ POSITIVE*  AMPHETMU NONE DETECTED  THCU NONE DETECTED  LABBARB NONE DETECTED    Alcohol Level  Recent Labs  Lab 05/23/22 2034  ETH <10    IMAGING past 24 hours ECHOCARDIOGRAM COMPLETE  Result Date: 05/25/2022    ECHOCARDIOGRAM REPORT   Patient Name:   Patrick Woodard Date of Exam: 05/25/2022 Medical Rec #:  [id number]  Height:       72.0 in Accession #:    [id number] Weight:       215.0 lb Date of Birth:  1971/06/23  BSA:          2.197 m Patient Age:    51 years   BP:           113/50 mmHg Patient Gender: M          HR:           68 bpm. Exam Location:   Inpatient Procedure: 2D Echo Indications:    stroke  History:        Patient has no prior history of Echocardiogram examinations.                 COPD and Stroke, Signs/Symptoms:Chest Pain; Risk                 Factors:Hypertension, Dyslipidemia and Diabetes.  Sonographer:    Harvie Junior Referring Phys: [id number] [employer]  Sonographer Comments: No subcostal window. Patient supne per neurololgy request. IMPRESSIONS  1. Left ventricular ejection fraction, by estimation, is 50 to 55%. The left ventricle has low normal function. The left ventricle has no regional wall motion abnormalities. Left ventricular diastolic parameters were normal.  2. Right ventricular systolic function is normal. The right ventricular size is normal.  3. The mitral  valve is normal in structure. Trivial mitral valve regurgitation. No evidence of mitral stenosis.  4. The aortic valve is tricuspid. Aortic valve regurgitation is mild. Aortic valve sclerosis/calcification is present, without any evidence of aortic stenosis. Aortic valve area, by VTI measures 4.08 cm. Aortic valve mean gradient measures 2.0 mmHg. Aortic valve Vmax measures 1.03 m/s.  5. Aortic dilatation noted. There is mild dilatation of the aortic root, measuring 41 mm. There is mild dilatation of the ascending aorta, measuring 42 mm. FINDINGS  Left Ventricle: Left ventricular ejection fraction, by estimation, is 50 to 55%. The left ventricle has low normal function. The left ventricle has no regional wall motion abnormalities. The left ventricular internal cavity size was normal in size. There is no left ventricular hypertrophy. Left ventricular diastolic parameters were normal. Normal left ventricular filling pressure. Right Ventricle: The right ventricular size is normal. No increase in right ventricular wall thickness. Right ventricular systolic function is normal. Left Atrium: Left atrial size was normal in size. Right Atrium: Right atrial size was normal in size.  Pericardium: There is no evidence of pericardial effusion. Mitral Valve: The mitral valve is normal in structure. Trivial mitral valve regurgitation. No evidence of mitral valve stenosis. Tricuspid Valve: The tricuspid valve is normal in structure. Tricuspid valve regurgitation is mild . No evidence of tricuspid stenosis. Aortic Valve: The aortic valve is tricuspid. Aortic valve regurgitation is mild. Aortic regurgitation PHT measures 701 msec. Aortic valve sclerosis/calcification is present, without any evidence of aortic stenosis. Aortic valve mean gradient measures 2.0  mmHg. Aortic valve peak gradient measures 4.2 mmHg. Aortic valve area, by VTI measures 4.08 cm. Pulmonic Valve: The pulmonic valve was normal in structure. Pulmonic valve regurgitation is trivial. No evidence of pulmonic stenosis. Aorta: Aortic dilatation noted. There is mild dilatation of the aortic root, measuring 41 mm. There is mild dilatation of the ascending aorta, measuring 42 mm. Venous: The inferior vena cava was not well visualized. IAS/Shunts: No atrial level shunt detected by color flow Doppler.  LEFT VENTRICLE PLAX 2D LVIDd:         5.10 cm      Diastology LVIDs:         3.60 cm      LV e' medial:    8.92 cm/s LV PW:         0.80 cm      LV E/e' medial:  11.0 LV IVS:        0.90 cm      LV e' lateral:   10.10 cm/s LVOT diam:     2.30 cm      LV E/e' lateral: 9.7 LV SV:         87 LV SV Index:   40 LVOT Area:     4.15 cm  LV Volumes (MOD) LV vol d, MOD A2C: 102.0 ml LV vol d, MOD A4C: 107.0 ml LV vol s, MOD A2C: 63.4 ml LV vol s, MOD A4C: 55.9 ml LV SV MOD A2C:     38.6 ml LV SV MOD A4C:     107.0 ml LV SV MOD BP:      45.1 ml RIGHT VENTRICLE RV Basal diam:  3.40 cm RV Mid diam:    2.40 cm RV S prime:     10.60 cm/s TAPSE (M-mode): 1.9 cm LEFT ATRIUM             Index        RIGHT ATRIUM  Index LA diam:        3.00 cm 1.37 cm/m   RA Area:     13.40 cm LA Vol (A2C):   72.5 ml 33.00 ml/m  RA Volume:   26.00 ml  11.83 ml/m  LA Vol (A4C):   53.6 ml 24.40 ml/m LA Biplane Vol: 62.3 ml 28.36 ml/m  AORTIC VALVE                    PULMONIC VALVE AV Area (Vmax):    4.01 cm     PV Vmax:          0.95 m/s AV Area (Vmean):   3.77 cm     PV Peak grad:     3.6 mmHg AV Area (VTI):     4.08 cm     PR End Diast Vel: 2.92 msec AV Vmax:           103.00 cm/s AV Vmean:          70.900 cm/s AV VTI:            0.214 m AV Peak Grad:      4.2 mmHg AV Mean Grad:      2.0 mmHg LVOT Vmax:         99.40 cm/s LVOT Vmean:        64.300 cm/s LVOT VTI:          0.210 m LVOT/AV VTI ratio: 0.98 AI PHT:            701 msec  AORTA Ao Root diam: 4.10 cm Ao Asc diam:  4.20 cm MITRAL VALVE               TRICUSPID VALVE MV Area (PHT): 3.99 cm    TR Peak grad:   23.6 mmHg MV Decel Time: 190 msec    TR Mean grad:   14.0 mmHg MR Peak grad: 106.1 mmHg   TR Vmax:        243.00 cm/s MR Vmax:      515.00 cm/s  TR Vmean:       179.0 cm/s MV E velocity: 98.10 cm/s MV A velocity: 66.00 cm/s  SHUNTS MV E/A ratio:  1.49        Systemic VTI:  0.21 m                            Systemic Diam: 2.30 cm Fransico Him MD Electronically signed by Fransico Him MD Signature Date/Time: 05/25/2022/9:13:55 AM    Final     --PHYSICAL EXAM-- Constitutional: Well nourished, middle aged man, in no acute distress HEENT: Normocephalic, Normal conjunctiva, anicteric. Hearing grossly intact. No nasal discharge. Neck: Neck is supple. No masses or thyromegaly. Resp: Respirations are non-labored. No wheezing. Skin: Warm. No rashes or ulcers.   Neuro  Mental Status AAOx3  Language: Fluency, Comprehension, and Naming intact  Cranial Nerves II: Nor abnormalities in visual fields. III, IV, VI: EOM intact, no gaze preference or deviation, no nystagmus  V: Normal sensation in V1, V2, and V3 segments bilaterally  VII: Facial symmetry at rest and during various facial expressions VIII: Normal hearing to speech  IX, X: Normal palatal elevation, no uvular deviation  XI: 5/5 head turn and 5/5  shoulder shrug bilaterally XII: Symmetric tongue movement, tongue is midline without atrophy or fasciculations.   Motor Strength R UE: 5/5 L UE: 5/5 R LE: 5/5 L LE: 5/5  Sensory  Intact sensation of UE and LE bilaterally.   Cerebellar: FNF: WNL Heel-to-Shin Test: WNL  ASSESSMENT/PLAN Mr. Patrick Woodard is a 51 y.o. male with history of  aortic dissection, COPD, DM2, HTN, ?hx of cerebral amyloid angiopathy vs hypertension, hx of aortic dissection s/p repair in 2014 who was driving back from restaurant when he reports sudden onset chest pain. EMS called and he was noted to have profound R sided weakness along with R hemianopsia and extinction. CTH negative for ICH, no LVO on CT angio head and neck. Tnkase was considered and STAT MRI Was obtained which demonstrated watershed infarct in L ACA/MCA/PCA distribution without any LVO. MRI also shows more than 10 microhemorrhages on SWI images and thus tnkase was not offered. Today he reports feeling well, patient will forward with watchman device placement.   Stroke - scattered acute/subacute left hemisphere infarcts in watershed distribution, concerning for cardioembolic source in the setting of prior afib hx s/p ablation Code Stroke CT head. No acute abnormality. Atrophy. ASPECTS 10.    CTA head & neck  - Stable Aortic dissection extending from proximal to the innominate artery to the level of the renal arteries MRI  Scattered punctate infarct of L hemisphere concern w/ watershed distribution.  2D Echo: LVEF 50-55%, no structural abnormalities LE venous Doppler negative for DVT LDL - 40 HgbA1c 7.6 VTE prophylaxis - Heparin subq No antithrombotic prior to admission, now on ASA 81 mg and Plavix 75 mg. Continue on discharge until sees Dr. Quentin Ore Therapy recommendations:  pending Disposition:  pending  Cerebral micro bleeds 04/2018 HA followed with neurology Dr. Ermalene Postin and MRI showed several CMBs 08/2019 episode of confusion lasted 48min without  recall the event, Dr. Ermalene Postin concerning for "amyloid spells" from [place] to refer to [facility] for further diagnosis but did not pursue This admission MRI showed Scattered chronic microhemorrhages (10+) in a peripheral, lobar distribution, which is compatible with amyloid angiopathy. 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  Exact etiology uncertain, CAA vs. hypertensive  Hx of Atrial Fibrillation s/p ablation S/p ablation in 2013 Normal sinus rhythm right now Pt complains of heart palpitation average 1-2/year, not sure whether asymptomatic afib Discussed with pt and wife about AC vs. Watchman device, patient has decided on watchman device placement.  Will set up outpt follow up with Dr. 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 Home meds: Lopressor 10 mg Stable BP and HR today   Other Stroke Risk Factors  Obstructive sleep apnea, on CPAP at home - follows with Dr. Brett Fairy   Other Active Problems Concern for aspiration - Unasyn day 2/7  Hospital day # 2  Christene Slates, MD PGY-1   ATTENDING NOTE: I reviewed above note and agree with the assessment and plan. Pt was seen and examined.   Wife at bedside.  Patient neurologically intact, no deficit.  Discussed with EP, given history of A-fib with current stroke, recommend AC instead of loop recorder.  Discussed with wife and patient  about long-term AC versus Watchman device, patient decided on Watchman device.  We  will continue DAPT on discharge, will set up appointment with Dr. Quentin Ore as outpatient.  Continue statin.  PT therapy no recommendation.  For detailed assessment and plan, please refer to above/below as I have made changes wherever appropriate.   Neurology will sign off. Please call with questions. Pt will follow up with Dr. Brett Fairy at [facility] in about 4 weeks. Thanks for the consult.   Rosalin Hawking, MD PhD Stroke Neurology 05/25/2022 5:13 PM    To contact Stroke Continuity provider, please refer to [email or web address] After hours, contact General Neurology

## 2022-05-25 NOTE — TOC Transition Note (Signed)
Transition of Care [facility]) - CM/SW Discharge Note   Patient Details  Name: Patrick Woodard MRN: [id number] Date of Birth: 09-27-1970  [facility]) CM/SW Contact:  Bartholomew Crews, RN Phone Number: [phone] 05/25/2022, 4:11 PM   Clinical Narrative:     Spoke with patient and family at the bedside to discuss recommendations for outpatient PT and OT. Patient is agreeable. Discussed preferred location. Referral sent to [facility]. No further TOC needs identified at this time.   Final next level of care: OP Rehab Barriers to Discharge: No Barriers Identified   Patient Goals and CMS Choice        Discharge Placement                       Discharge Plan and Services                                     Social Determinants of Health (SDOH) Interventions     Readmission Risk Interventions     No data to display

## 2022-05-25 NOTE — Telephone Encounter (Signed)
LVM for pt to call back to schedule sleep study.  

## 2022-05-25 NOTE — Evaluation (Signed)
Occupational Therapy Evaluation Patient Details Name: Patrick Woodard MRN: [id number] DOB: February 28, 1971 Today's Date: 05/25/2022   History of Present Illness 51 yo male presents to [facility] on 9/16 with R arm weakness, R-sided neglect, headache. 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  MRI also shows more than 10 microhemorrhages on SWI images and thus tnkase was not offered. PMH includes COPD, DM2, HTN, ?hx of cerebral amyloid angiopathy vs hypertension, hx of aortic dissection s/p repair in 2014   Clinical Impression   Pt is functioning at an independent to supervision level in ADLs and mobility. He was noted to have some R wrist posturing with ambulation and mild incoordination. Pt minimizing symptoms as he is eager to go home. Pt appearing to have headache, but denying pain. Wife indicates pt's cognition is not at his baseline. Recommending OPOT for further assessment of cognition and R UE deficits.      Recommendations for follow up therapy are one component of a multi-disciplinary discharge planning process, led by the attending physician.  Recommendations may be updated based on patient status, additional functional criteria and insurance authorization.   Follow Up Recommendations  Outpatient OT    Assistance Recommended at Discharge Intermittent Supervision/Assistance  Patient can return home with the following Assist for transportation    Functional Status Assessment  Patient has had a recent decline in their functional status and demonstrates the ability to make significant improvements in function in a reasonable and predictable amount of time.  Equipment Recommendations  None recommended by OT    Recommendations for Other Services       Precautions / Restrictions Precautions Precautions: Fall Restrictions Weight Bearing Restrictions: No      Mobility Bed Mobility Overal bed mobility: Needs Assistance Bed Mobility: Supine to Sit, Sit to Supine     Supine to  sit: Supervision Sit to supine: Supervision        Transfers Overall transfer level: Needs assistance Equipment used: None Transfers: Sit to/from Stand Sit to Stand: Supervision           General transfer comment: for safety and lines      Balance Overall balance assessment: Needs assistance   Sitting balance-Leahy Scale: Good       Standing balance-Leahy Scale: Fair                             ADL either performed or assessed with clinical judgement   ADL Overall ADL's : Needs assistance/impaired Eating/Feeding: Independent   Grooming: Supervision/safety;Standing   Upper Body Bathing: Set up;Sitting   Lower Body Bathing: Supervison/ safety   Upper Body Dressing : Set up;Sitting   Lower Body Dressing: Set up;Sitting/lateral leans   Toilet Transfer: Supervision/safety   Toileting- Clothing Manipulation and Hygiene: Supervision/safety       Functional mobility during ADLs: Supervision/safety       Vision Baseline Vision/History: 1 Wears glasses Ability to See in Adequate Light: 0 Adequate Patient Visual Report: No change from baseline Additional Comments: visual fields and tracking WNL     Perception     Praxis      Pertinent Vitals/Pain Pain Assessment Pain Assessment: No/denies pain     Hand Dominance Left   Extremity/Trunk Assessment Upper Extremity Assessment Upper Extremity Assessment: Defer to OT evaluation RUE Deficits / Details: strength WFL, denies sensory changes, thumb to finger movements similar to that on L, pt noted to posture R wrist when ambulating with very minimal arm  swing, used R UE effectively when donning socks   Lower Extremity Assessment Lower Extremity Assessment: RLE deficits/detail RLE Deficits / Details: grossly 5/5 knee flex/ext, 3+/5 hip flexion/ext/abd.  + rebound during MMT testing and min incoordination during mobility.   Cervical / Trunk Assessment Cervical / Trunk Assessment: Normal    Communication Communication Communication: No difficulties   Cognition Arousal/Alertness: Awake/alert Behavior During Therapy: Flat affect                                   General Comments: pt reports he is not experiencing any cognitive changes, wife indicated he is not at his baseline, pt minimally conversive, but oriented and well aware of situation and medical plan, minimizes concerns and eager to return to work     General Comments       Exercises     Shoulder [place] expects to be discharged to:: Private residence Living Arrangements: Spouse/significant other Available Help at Discharge: Family;Available PRN/intermittently Type of Home: House Home Access: Stairs to enter [employer] of Steps: 2   Home Layout: One level     Bathroom Shower/Tub: Walk-in shower;Tub/shower unit   Bathroom Toilet: Standard     Home Equipment: None   Additional Comments: wife can work from home      Prior Functioning/Environment Prior Level of Function : Independent/Modified Independent;Working/employed;Driving               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        OT Problem List: Impaired balance (sitting and/or standing);Decreased coordination;Decreased cognition      OT Treatment/Interventions:      OT Goals(Current goals can be found in the care plan section)    OT Frequency:      Co-evaluation              AM-PAC OT "6 Clicks" Daily Activity     Outcome Measure Help from another person eating meals?: None Help from another person taking care of personal grooming?: None Help from another person toileting, which includes using toliet, bedpan, or urinal?: None Help from another person bathing (including washing, rinsing, drying)?: None Help from another person to put on and taking off regular upper body clothing?: None Help from another person to put on and taking off  regular lower body clothing?: None 6 Click Score: 24   End of Session Equipment Utilized During Treatment: Gait belt Nurse Communication: Mobility status  Activity Tolerance: Patient tolerated treatment well Patient left: in bed;with call bell/phone within reach;with family/visitor present  OT Visit Diagnosis: Other symptoms and signs involving cognitive function;Muscle weakness (generalized) (M62.81)                Time: 2683-4196 OT Time Calculation (min): 25 min Charges:  OT General Charges $OT Visit: 1 Visit OT Evaluation $OT Eval Low Complexity: 1 Low  Cleta Alberts, OTR/L Acute Rehabilitation Services Office: [phone]  Malka So 05/25/2022, 4:23 PM

## 2022-05-25 NOTE — Plan of Care (Signed)
  Problem: Education: Goal: Ability to describe self-care measures that may prevent or decrease complications (Diabetes Survival Skills Education) will improve Outcome: Progressing Goal: Individualized Educational Video(s) Outcome: Progressing   Problem: Coping: Goal: Ability to adjust to condition or change in health will improve Outcome: Progressing   Problem: Fluid Volume: Goal: Ability to maintain a balanced intake and output will improve Outcome: Progressing   Problem: Health Behavior/Discharge Planning: Goal: Ability to identify and utilize available resources and services will improve Outcome: Progressing Goal: Ability to manage health-related needs will improve Outcome: Progressing   Problem: Metabolic: Goal: Ability to maintain appropriate glucose levels will improve Outcome: Progressing   Problem: Nutritional: Goal: Maintenance of adequate nutrition will improve Outcome: Progressing Goal: Progress toward achieving an optimal weight will improve Outcome: Progressing   Problem: Skin Integrity: Goal: Risk for impaired skin integrity will decrease Outcome: Progressing   Problem: Tissue Perfusion: Goal: Adequacy of tissue perfusion will improve Outcome: Progressing   Problem: Education: Goal: Knowledge of General Education information will improve Description: Including pain rating scale, medication(s)/side effects and non-pharmacologic comfort measures Outcome: Progressing   Problem: Health Behavior/Discharge Planning: Goal: Ability to manage health-related needs will improve Outcome: Progressing   Problem: Clinical Measurements: Goal: Ability to maintain clinical measurements within normal limits will improve Outcome: Progressing Goal: Will remain free from infection Outcome: Progressing Goal: Diagnostic test results will improve Outcome: Progressing Goal: Respiratory complications will improve Outcome: Progressing Goal: Cardiovascular complication will  be avoided Outcome: Progressing   Problem: Activity: Goal: Risk for activity intolerance will decrease Outcome: Progressing   Problem: Nutrition: Goal: Adequate nutrition will be maintained Outcome: Progressing   Problem: Coping: Goal: Level of anxiety will decrease Outcome: Progressing   Problem: Elimination: Goal: Will not experience complications related to bowel motility Outcome: Progressing Goal: Will not experience complications related to urinary retention Outcome: Progressing   Problem: Pain Managment: Goal: General experience of comfort will improve Outcome: Progressing   Problem: Safety: Goal: Ability to remain free from injury will improve Outcome: Progressing   Problem: Skin Integrity: Goal: Risk for impaired skin integrity will decrease Outcome: Progressing   Problem: Education: Goal: Knowledge of disease or condition will improve Outcome: Progressing Goal: Knowledge of secondary prevention will improve (SELECT ALL) Outcome: Progressing Goal: Knowledge of patient specific risk factors will improve (INDIVIDUALIZE FOR PATIENT) Outcome: Progressing Goal: Individualized Educational Video(s) Outcome: Progressing   Problem: Coping: Goal: Will verbalize positive feelings about self Outcome: Progressing Goal: Will identify appropriate support needs Outcome: Progressing   Problem: Health Behavior/Discharge Planning: Goal: Ability to manage health-related needs will improve Outcome: Progressing   Problem: Self-Care: Goal: Ability to participate in self-care as condition permits will improve Outcome: Progressing Goal: Verbalization of feelings and concerns over difficulty with self-care will improve Outcome: Progressing Goal: Ability to communicate needs accurately will improve Outcome: Progressing   Problem: Nutrition: Goal: Risk of aspiration will decrease Outcome: Progressing Goal: Dietary intake will improve Outcome: Progressing

## 2022-05-25 NOTE — Discharge Summary (Signed)
Physician Discharge Summary   Patient: Patrick Woodard MRN: [id number] DOB: Aug 12, 1971  Admit date:     05/23/2022  Discharge date: 05/25/22  Discharge Physician: Oswald Hillock   PCP: Gifford Shave, MD   Recommendations at discharge:   Follow-up cardiology as outpatient Follow-up neurology as outpatient  Discharge Diagnoses: Principal Problem:   Acute CVA (cerebrovascular accident) [facility]) Active Problems:   Chest pain   Acute hypoxemic respiratory failure ([place])   Emesis   COPD (chronic obstructive pulmonary disease) ([place])   Type 2 diabetes mellitus ([place])   Essential hypertension   Hyperlipidemia  Resolved Problems:   * No resolved hospital problems. *  Hospital Course: 51 year old male with medical history of COPD, diabetes mellitus type 2, hypertension, history of cerebral amyloid angiopathy, history of aortic dissection, s/p repair in 2014.  EMS was called for patient having sudden onset of chest pain also had right-sided weakness along with right hemianopsia and extinction.  Code stroke was activated.  Patient's symptom resolved in 10 minutes. In the ED CT head was negative for intracranial hemorrhage, CTA head and neck was negative for LVO.  Given the history of dissection a CT dissection study was done which showed stable aortic dissection.  CT results were discussed with CT surgeon Dr. Sharlett Iles who felt there was no new dissection today and was safe to give tPA from his standpoint.  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  Neurology did not give TNK is due to more than 10 microhemorrhages on SWI imaging with reported history of cerebral amyloid angiopathy.  D-dimer was elevated 3.55.      Assessment and Plan:  Acute CVA -Presented with right-sided weakness along with right hemianopsia and extinction -CT head unremarkable; CTA head and neck negative for LVO -Brain MRI showed left hemisphere watershed distribution infarct -Neurology consulted; no TNKase  given due to more than 10 microhemorrhages on SWI imaging with reported history of cerebral amyloid angiopathy -Echocardiogram pending -Follow hemoglobin A1c, 7.6 lipid panel -Started on aspirin 81 mg daily along with Plavix 75 mg daily  -Permissive hypertension for first 24 hours, less than 220/110; and home medications -Patient is stable for discharge -Neurology has cleared him for discharge on both aspirin and Plavix -Outpatient OT/PT will be set up  Hx of Atrial Fibrillation s/p ablation S/p ablation in 2013 Normal sinus rhythm right now Pt complains of heart palpitation average once a month EP will follow as outpatient for watchman device Discussed with EP and neurology    Acute hypoxemic respiratory failure/chest pain -Patient experienced right-sided chest pain after finishing dinner at restaurant -Troponin negative -CT dissection study showed stable aortic dissection; results discussed with CT surgeon who did not feel that there was no dissection -Patient was hypoxemic on presentation with O2 sats in 80s after he received Ativan and vomited multiple times in the MRI scanner -D-dimer is positive -No evidence of PE on the CTA chest Patient empirically started on Unasyn for-concern for aspiration -will wean off oxygen - will discharge on Augmentin for 2 more days. -Troponin level 3, 4   Elevated D-dimer -D-dimer elevated at 3.55 -Venous duplex of lower extremities was negative for DVT   Emesis -Occurred after patient received Ativan -No nausea vomiting at this time  Diabetes mellitus type 2 -Hemoglobin A1c 7.6 - patient does not want to start medication for diabetes mellitus at this time -will follow up pcp in 2 weeks as outpatient to discuss starting meds   COPD -Stable; no acute exacerbation  Hypertension -Allow permissive hypertension   Hyperlipidemia -Continue Lipitor        Consultants: Neurology Procedures performed: venous duplex of lower  extremities, echocardiogram Disposition: Home Diet recommendation:  Discharge Diet Orders (From admission, onward)     Start     Ordered   05/25/22 0000  Diet - low sodium heart healthy        05/25/22 1324           Cardiac diet DISCHARGE MEDICATION: Allergies as of 05/25/2022       Reactions   Latex Dermatitis, Rash        Medication List     STOP taking these medications    aspirin 325 MG tablet Replaced by: aspirin 81 MG chewable tablet       TAKE these medications    amoxicillin-clavulanate 875-125 MG tablet Commonly known as: AUGMENTIN Take 1 tablet by mouth 2 (two) times daily.   Armodafinil 150 MG tablet Take 1 tablet (150 mg total) by mouth daily.   aspirin 81 MG chewable tablet Chew 1 tablet (81 mg total) by mouth daily. Start taking on: May 26, 2022 Replaces: aspirin 325 MG tablet   atorvastatin 10 MG tablet Commonly known as: LIPITOR Take 10 mg by mouth daily.   clopidogrel 75 MG tablet Commonly known as: PLAVIX Take 1 tablet (75 mg total) by mouth daily. Start taking on: May 26, 2022   loratadine 10 MG tablet Commonly known as: CLARITIN Take 10 mg by mouth daily.   metoprolol tartrate 25 MG tablet Commonly known as: LOPRESSOR Take 1.5 tablets by mouth 2 (two) times daily.   montelukast 10 MG tablet Commonly known as: SINGULAIR Take 10 mg by mouth daily.   sildenafil 100 MG tablet Commonly known as: VIAGRA Take 100 mg by mouth at bedtime as needed for erectile dysfunction.   sotalol 80 MG tablet Commonly known as: BETAPACE Take 80 mg by mouth 2 (two) times daily.        Follow-up Information     Dohmeier, Asencion Partridge, MD. Schedule an appointment as soon as possible for a visit in 1 month(s).   Specialty: Neurology Contact information: [address] [place] [address] [phone]         Vickie Epley, MD. Schedule an appointment as soon as possible for a visit.   Specialties:  Cardiology, Radiology Contact information: [place] [address] [phone]                Discharge Exam: Danley Danker Weights   05/23/22 2000 05/24/22 0410  Weight: 99.6 kg 97.5 kg     Condition at discharge: good  The results of significant diagnostics from this hospitalization (including imaging, microbiology, ancillary and laboratory) are listed below for reference.   Imaging Studies: ECHOCARDIOGRAM COMPLETE  Result Date: 05/25/2022    ECHOCARDIOGRAM REPORT   Patient Name:   Patrick Woodard Date of Exam: 05/25/2022 Medical Rec #:  [id number]  Height:       72.0 in Accession #:    [id number] Weight:       215.0 lb Date of Birth:  [birth year]  BSA:          2.197 m Patient Age:    51 years   BP:           113/50 mmHg Patient Gender: M          HR:           68 bpm. Exam  Location:  Inpatient Procedure: 2D Echo Indications:    stroke  History:        Patient has no prior history of Echocardiogram examinations.                 COPD and Stroke, Signs/Symptoms:Chest Pain; Risk                 Factors:Hypertension, Dyslipidemia and Diabetes.  Sonographer:    Harvie Junior Referring Phys: [id number] [place] XU  Sonographer Comments: No subcostal window. Patient supne per neurololgy request. IMPRESSIONS  1. Left ventricular ejection fraction, by estimation, is 50 to 55%. The left ventricle has low normal function. The left ventricle has no regional wall motion abnormalities. Left ventricular diastolic parameters were normal.  2. Right ventricular systolic function is normal. The right ventricular size is normal.  3. The mitral valve is normal in structure. Trivial mitral valve regurgitation. No evidence of mitral stenosis.  4. The aortic valve is tricuspid. Aortic valve regurgitation is mild. Aortic valve sclerosis/calcification is present, without any evidence of aortic stenosis. Aortic valve area, by VTI measures 4.08 cm. Aortic valve mean gradient measures 2.0 mmHg. Aortic valve  Vmax measures 1.03 m/s.  5. Aortic dilatation noted. There is mild dilatation of the aortic root, measuring 41 mm. There is mild dilatation of the ascending aorta, measuring 42 mm. FINDINGS  Left Ventricle: Left ventricular ejection fraction, by estimation, is 50 to 55%. The left ventricle has low normal function. The left ventricle has no regional wall motion abnormalities. The left ventricular internal cavity size was normal in size. There is no left ventricular hypertrophy. Left ventricular diastolic parameters were normal. Normal left ventricular filling pressure. Right Ventricle: The right ventricular size is normal. No increase in right ventricular wall thickness. Right ventricular systolic function is normal. Left Atrium: Left atrial size was normal in size. Right Atrium: Right atrial size was normal in size. Pericardium: There is no evidence of pericardial effusion. Mitral Valve: The mitral valve is normal in structure. Trivial mitral valve regurgitation. No evidence of mitral valve stenosis. Tricuspid Valve: The tricuspid valve is normal in structure. Tricuspid valve regurgitation is mild . No evidence of tricuspid stenosis. Aortic Valve: The aortic valve is tricuspid. Aortic valve regurgitation is mild. Aortic regurgitation PHT measures 701 msec. Aortic valve sclerosis/calcification is present, without any evidence of aortic stenosis. Aortic valve mean gradient measures 2.0  mmHg. Aortic valve peak gradient measures 4.2 mmHg. Aortic valve area, by VTI measures 4.08 cm. Pulmonic Valve: The pulmonic valve was normal in structure. Pulmonic valve regurgitation is trivial. No evidence of pulmonic stenosis. Aorta: Aortic dilatation noted. There is mild dilatation of the aortic root, measuring 41 mm. There is mild dilatation of the ascending aorta, measuring 42 mm. Venous: The inferior vena cava was not well visualized. IAS/Shunts: No atrial level shunt detected by color flow Doppler.  LEFT VENTRICLE PLAX 2D  LVIDd:         5.10 cm      Diastology LVIDs:         3.60 cm      LV e' medial:    8.92 cm/s LV PW:         0.80 cm      LV E/e' medial:  11.0 LV IVS:        0.90 cm      LV e' lateral:   10.10 cm/s LVOT diam:     2.30 cm      LV E/e' lateral: 9.7  LV SV:         87 LV SV Index:   40 LVOT Area:     4.15 cm  LV Volumes (MOD) LV vol d, MOD A2C: 102.0 ml LV vol d, MOD A4C: 107.0 ml LV vol s, MOD A2C: 63.4 ml LV vol s, MOD A4C: 55.9 ml LV SV MOD A2C:     38.6 ml LV SV MOD A4C:     107.0 ml LV SV MOD BP:      45.1 ml RIGHT VENTRICLE RV Basal diam:  3.40 cm RV Mid diam:    2.40 cm RV S prime:     10.60 cm/s TAPSE (M-mode): 1.9 cm LEFT ATRIUM             Index        RIGHT ATRIUM           Index LA diam:        3.00 cm 1.37 cm/m   RA Area:     13.40 cm LA Vol (A2C):   72.5 ml 33.00 ml/m  RA Volume:   26.00 ml  11.83 ml/m LA Vol (A4C):   53.6 ml 24.40 ml/m LA Biplane Vol: 62.3 ml 28.36 ml/m  AORTIC VALVE                    PULMONIC VALVE AV Area (Vmax):    4.01 cm     PV Vmax:          0.95 m/s AV Area (Vmean):   3.77 cm     PV Peak grad:     3.6 mmHg AV Area (VTI):     4.08 cm     PR End Diast Vel: 2.92 msec AV Vmax:           103.00 cm/s AV Vmean:          70.900 cm/s AV VTI:            0.214 m AV Peak Grad:      4.2 mmHg AV Mean Grad:      2.0 mmHg LVOT Vmax:         99.40 cm/s LVOT Vmean:        64.300 cm/s LVOT VTI:          0.210 m LVOT/AV VTI ratio: 0.98 AI PHT:            701 msec  AORTA Ao Root diam: 4.10 cm Ao Asc diam:  4.20 cm MITRAL VALVE               TRICUSPID VALVE MV Area (PHT): 3.99 cm    TR Peak grad:   23.6 mmHg MV Decel Time: 190 msec    TR Mean grad:   14.0 mmHg MR Peak grad: 106.1 mmHg   TR Vmax:        243.00 cm/s MR Vmax:      515.00 cm/s  TR Vmean:       179.0 cm/s MV E velocity: 98.10 cm/s MV A velocity: 66.00 cm/s  SHUNTS MV E/A ratio:  1.49        Systemic VTI:  0.21 m                            Systemic Diam: 2.30 cm Fransico Him MD Electronically signed by Fransico Him MD Signature  Date/Time: 05/25/2022/9:13:55 AM    Final    VAS [place] LOWER EXTREMITY VENOUS (  DVT)  Result Date: 05/24/2022  Lower Venous DVT Study Patient Name:  Patrick Woodard  Date of Exam:   05/24/2022 Medical Rec #: [id number]   Accession #:    [id number] Date of Birth: [birth year]   Patient Gender: M Patient Age:   33 years Exam Location:  [facility] Procedure:      VAS [place] LOWER EXTREMITY VENOUS (DVT) Referring Phys: Wandra Feinstein RATHORE --------------------------------------------------------------------------------  Indications: Stroke, and Positive D-dimer (3.55).  Limitations: Poor ultrasound/tissue interface. Comparison Study: No previous exams Performing Technologist: Jody Hill RVT, RDMS  Examination Guidelines: A complete evaluation includes B-mode imaging, spectral Doppler, color Doppler, and power Doppler as needed of all accessible portions of each vessel. Bilateral testing is considered an integral part of a complete examination. Limited examinations for reoccurring indications may be performed as noted. The reflux portion of the exam is performed with the patient in reverse Trendelenburg.  +---------+---------------+---------+-----------+----------+--------------+ RIGHT    CompressibilityPhasicitySpontaneityPropertiesThrombus Aging +---------+---------------+---------+-----------+----------+--------------+ CFV      Full           Yes      Yes                                 +---------+---------------+---------+-----------+----------+--------------+ SFJ      Full                                                        +---------+---------------+---------+-----------+----------+--------------+ FV Prox  Full           Yes      Yes                                 +---------+---------------+---------+-----------+----------+--------------+ FV Mid   Full           Yes      Yes                                 +---------+---------------+---------+-----------+----------+--------------+ FV  DistalFull           Yes      Yes                                 +---------+---------------+---------+-----------+----------+--------------+ PFV      Full                                                        +---------+---------------+---------+-----------+----------+--------------+ POP      Full           Yes      Yes                                 +---------+---------------+---------+-----------+----------+--------------+ PTV      Full                                                        +---------+---------------+---------+-----------+----------+--------------+  PERO     Full                                                        +---------+---------------+---------+-----------+----------+--------------+   +---------+---------------+---------+-----------+----------+--------------+ LEFT     CompressibilityPhasicitySpontaneityPropertiesThrombus Aging +---------+---------------+---------+-----------+----------+--------------+ CFV      Full           Yes      Yes                                 +---------+---------------+---------+-----------+----------+--------------+ SFJ      Full                                                        +---------+---------------+---------+-----------+----------+--------------+ FV Prox  Full           Yes      Yes                                 +---------+---------------+---------+-----------+----------+--------------+ FV Mid   Full           Yes      Yes                                 +---------+---------------+---------+-----------+----------+--------------+ FV DistalFull           Yes      Yes                                 +---------+---------------+---------+-----------+----------+--------------+ PFV      Full                                                        +---------+---------------+---------+-----------+----------+--------------+ POP      Full           Yes      Yes                                  +---------+---------------+---------+-----------+----------+--------------+ PTV      Full                                                        +---------+---------------+---------+-----------+----------+--------------+ PERO     Full                                                        +---------+---------------+---------+-----------+----------+--------------+  Summary: BILATERAL: - No evidence of deep vein thrombosis seen in the lower extremities, bilaterally. -No evidence of popliteal cyst, bilaterally.   *See table(s) above for measurements and observations.    Preliminary    DG Chest Portable 1 View  Result Date: 05/24/2022 CLINICAL DATA:  Question aspiration on MRI.  Chest pain. EXAM: PORTABLE CHEST 1 VIEW COMPARISON:  Chest x-ray 09/19/2019. CT chest abdomen and pelvis 05/23/2022. FINDINGS: Aorta is ectatic as seen on the prior study. Cardiac silhouette is enlarged, unchanged. Median sternotomy wires are again seen. Lung volumes are low likely accentuating central pulmonary vascularity. The lungs are clear. There is no pleural effusion or pneumothorax. No acute fractures are seen. IMPRESSION: No acute cardiopulmonary process. Stable cardiomegaly. Electronically Signed   By: Ronney Asters M.D.   On: 05/24/2022 00:04   MR BRAIN WO CONTRAST  Result Date: 05/23/2022 CLINICAL DATA:  Stroke follow-up.  Diagnosis of amyloidosis. EXAM: MRI HEAD WITHOUT CONTRAST TECHNIQUE: Multiplanar, multiecho pulse sequences of the brain and surrounding structures were obtained without intravenous contrast. COMPARISON:  04/09/2018 brain MRI FINDINGS: Brain: Susceptibility weighted imaging shows remote hemorrhage along the medial aspect of the left thalamus. Additionally, there are scattered chronic microhemorrhages within both hemispheres in a peripheral, lobar distribution. Compared to 04/19/2018, there are a few more hemorrhages now visible (left temporal lobe, cerebellum) but it  is not possible to know whether these are truly new hemorrhages or due to increased sensitivity of susceptibility weighted imaging versus the GRE sequence on the older study. There is multifocal periventricular white matter hyperintensity, most often a result of chronic microvascular ischemia. No hydrocephalus or volume loss. Old right occipital infarct. Vascular: Major flow voids are preserved. Skull and upper cervical spine: Normal calvarium and skull base. Visualized upper cervical spine and soft tissues are normal. Sinuses/Orbits:No paranasal sinus fluid levels or advanced mucosal thickening. No mastoid or middle ear effusion. Normal orbits. IMPRESSION: 1. No acute intracranial abnormality. 2. Scattered chronic microhemorrhages in a peripheral, lobar distribution, which is compatible with amyloid angiopathy. 3. 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 Electronically Signed   By: Ulyses Jarred M.D.   On: 05/23/2022 23:22   MR BRAIN WO CONTRAST  Result Date: 05/23/2022 CLINICAL DATA:  Acute neurologic deficit EXAM: MRI HEAD WITHOUT CONTRAST TECHNIQUE: Multiplanar, multiecho pulse sequences of the brain and surrounding structures were obtained without intravenous contrast. COMPARISON:  None Available. FINDINGS: Only diffusion-weighted imaging was performed. This shows areas of abnormal diffusion restriction within the left hemisphere in a watershed distribution, involving the left frontal and parietal white matter and subcortical areas of the left occipital lobe. No midline shift or other mass effect. IMPRESSION: 1. Diffusion-weighted imaging showing left hemisphere watershed distribution infarcts. Electronically Signed   By: Ulyses Jarred M.D.   On: 05/23/2022 22:03   CT Angio Chest/Abd/Pel for Dissection W and/or W/WO  Result Date: 05/23/2022 CLINICAL DATA:  Chest pain.  Known aortic dissection. EXAM: CT ANGIOGRAPHY CHEST, ABDOMEN AND PELVIS TECHNIQUE: Non-contrast CT of the chest was initially obtained.  Multidetector CT imaging through the chest, abdomen and pelvis was performed using the standard protocol during bolus administration of intravenous contrast. Multiplanar reconstructed images and MIPs were obtained and reviewed to evaluate the vascular anatomy. RADIATION DOSE REDUCTION: This exam was performed according to the departmental dose-optimization program which includes automated exposure control, adjustment of the mA and/or kV according to patient size and/or use of iterative reconstruction technique. CONTRAST:  22mL OMNIPAQUE IOHEXOL 350 MG/ML SOLN COMPARISON:  04/18/2018 FINDINGS: CTA CHEST FINDINGS Cardiovascular: --  Heart: The heart size is normal.  There is nopericardial effusion. 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 Prior ascending aortic repair. The dissection extends into the innominate artery and the proximal right common carotid artery, unchanged. The other arch vessels are normal. --Pulmonary Arteries: Contrast timing is optimized for preferential opacification of the aorta. Within that limitation, normal central pulmonary arteries. Mediastinum/Nodes: No mediastinal, hilar or axillary lymphadenopathy. The visualized thyroid and thoracic esophageal course are unremarkable. Lungs/Pleura: No pulmonary nodules or masses. No pleural effusion or pneumothorax. No focal airspace consolidation. No focal pleural abnormality. Musculoskeletal: No chest wall abnormality. No acute osseous findings. Review of the MIP images confirms the above findings. CTA ABDOMEN AND PELVIS FINDINGS VASCULAR Aorta: Dissection extends to the level of the renal arteries. There is mild aortic atherosclerosis. Celiac: Arises from the true lumen.  Patent SMA: Arises from the true lumen. Renals: Arise below the level of the dissection flap. No aneurysm, dissection, stenosis or evidence of fibromuscular dysplasia. IMA: Patent without  abnormality. Inflow: No aneurysm, stenosis or dissection. Veins: Normal course and caliber of the major veins. Assessment is otherwise limited by the arterial dominant contrast phase. Review of the MIP images confirms the above findings. NON-VASCULAR Hepatobiliary: Normal hepatic contours and density. No visible biliary dilatation. Normal gallbladder. Pancreas: Normal contours without ductal dilatation. No peripancreatic fluid collection. Spleen: Normal arterial phase splenic enhancement pattern. Adrenals/Urinary Tract: --Adrenal glands: Normal. --Right kidney/ureter: No hydronephrosis or perinephric stranding. No nephrolithiasis. No obstructing ureteral stones. --Left kidney/ureter: No hydronephrosis or perinephric stranding. No nephrolithiasis. No obstructing ureteral stones. --Urinary bladder: Unremarkable. Stomach/Bowel: --Stomach/Duodenum: No hiatal hernia or other gastric abnormality. Normal duodenal course and caliber. --Small bowel: No dilatation or inflammation. --Colon: No focal abnormality. --Appendix: Normal. Lymphatic:  No abdominal or pelvic lymphadenopathy. Reproductive: Prostate is enlarged. Musculoskeletal. No bony spinal canal stenosis or focal osseous abnormality. Other: None. Review of the MIP images confirms the above findings. IMPRESSION: 1. Unchanged appearance of type a aortic dissection extending from proximal to the innominate artery to the level of the renal arteries, status post ascending aortic repair. 2. No new dissection or other acute abnormality of the chest, abdomen or pelvis. 3. Aortic Atherosclerosis (ICD10-I70.0). These results were called by telephone at the time of interpretation on 05/23/2022 at 8:53 pm to provider Dr. Wyvonnia Dusky, who verbally acknowledged these results. Electronically Signed   By: Ulyses Jarred M.D.   On: 05/23/2022 21:13   CT HEAD CODE STROKE WO CONTRAST  Result Date: 05/23/2022 CLINICAL DATA:  Stroke-like symptoms EXAM: CT HEAD WITHOUT CONTRAST CT  ANGIOGRAPHY OF THE HEAD AND NECK TECHNIQUE: Contiguous axial images were obtained from the base of the skull through the vertex without intravenous contrast. Multidetector CT imaging of the head and neck was performed using the standard protocol during bolus administration of intravenous contrast. Multiplanar CT image reconstructions and MIPs were obtained to evaluate the vascular anatomy. Carotid stenosis measurements (when applicable) are obtained utilizing NASCET criteria, using the distal internal carotid diameter as the denominator. RADIATION DOSE REDUCTION: This exam was performed according to the departmental dose-optimization program which includes automated exposure control, adjustment of the mA and/or kV according to patient size and/or use of iterative reconstruction technique. CONTRAST:  Ten 90 little burr COMPARISON:  None Available. FINDINGS: CT HEAD FINDINGS Brain: There is no mass, hemorrhage or extra-axial collection. The size and configuration of the ventricles and extra-axial CSF spaces are normal.  The brain parenchyma is normal, without evidence of acute or chronic infarction. Vascular: No abnormal hyperdensity of the major intracranial arteries or dural venous sinuses. No intracranial atherosclerosis. Skull: The visualized skull base, calvarium and extracranial soft tissues are normal. Sinuses/Orbits: No fluid levels or advanced mucosal thickening of the visualized paranasal sinuses. No mastoid or middle ear effusion. The orbits are normal. ASPECTS ([place] Stroke Program Early CT Score) - Ganglionic level infarction (caudate, lentiform nuclei, internal capsule, insula, M1-M3 cortex): 7 - Supraganglionic infarction (M4-M6 cortex): 3 Total score (0-10 with 10 being normal): 10 CTA NECK FINDINGS Aortic dissection is evaluated completely on concomitant CTA chest. RIGHT CAROTID SYSTEM: Skeleton: Normal. Other neck: Unremarkable. LEFT CAROTID SYSTEM: Normal without aneurysm, dissection or stenosis.  VERTEBRAL ARTERIES: Left dominant configuration. Both origins are clearly patent. There is no dissection, occlusion or flow-limiting stenosis to the skull base (V1-V3 segments). CTA HEAD FINDINGS POSTERIOR CIRCULATION: --Vertebral arteries: Normal V4 segments. --Inferior cerebellar arteries: Normal. --Basilar artery: Normal. --Superior cerebellar arteries: Normal. --Posterior cerebral arteries (PCA): Normal. ANTERIOR CIRCULATION: --Intracranial internal carotid arteries: Normal. --Anterior cerebral arteries (ACA): Normal. Both A1 segments are present. Patent anterior communicating artery (a-comm). --Middle cerebral arteries (MCA): Normal. VENOUS SINUSES: As permitted by contrast timing, patent. ANATOMIC VARIANTS: None Review of the MIP images confirms the above findings. IMPRESSION: 1. No intracranial hemorrhage or mass lesion. ASPECTS is 10. 2. No emergent large vessel occlusion or high-grade stenosis of the intracranial arteries. 3. Aortic dissection, evaluated completely on concomitant CTA chest. These results were called by telephone at the time of interpretation on 05/23/2022 at 8:57 pm to providers [facility] ; [facility] , who verbally acknowledged these results. Electronically Signed   By: Ulyses Jarred M.D.   On: 05/23/2022 20:58   CT ANGIO HEAD NECK W WO CM (CODE STROKE)  Result Date: 05/23/2022 CLINICAL DATA:  Stroke-like symptoms EXAM: CT HEAD WITHOUT CONTRAST CT ANGIOGRAPHY OF THE HEAD AND NECK TECHNIQUE: Contiguous axial images were obtained from the base of the skull through the vertex without intravenous contrast. Multidetector CT imaging of the head and neck was performed using the standard protocol during bolus administration of intravenous contrast. Multiplanar CT image reconstructions and MIPs were obtained to evaluate the vascular anatomy. Carotid stenosis measurements (when applicable) are obtained utilizing NASCET criteria, using the distal internal carotid diameter as the  denominator. RADIATION DOSE REDUCTION: This exam was performed according to the departmental dose-optimization program which includes automated exposure control, adjustment of the mA and/or kV according to patient size and/or use of iterative reconstruction technique. CONTRAST:  Ten 90 little burr COMPARISON:  None Available. FINDINGS: CT HEAD FINDINGS Brain: There is no mass, hemorrhage or extra-axial collection. The size and configuration of the ventricles and extra-axial CSF spaces are normal. The brain parenchyma is normal, without evidence of acute or chronic infarction. Vascular: No abnormal hyperdensity of the major intracranial arteries or dural venous sinuses. No intracranial atherosclerosis. Skull: The visualized skull base, calvarium and extracranial soft tissues are normal. Sinuses/Orbits: No fluid levels or advanced mucosal thickening of the visualized paranasal sinuses. No mastoid or middle ear effusion. The orbits are normal. ASPECTS ([place] Stroke Program Early CT Score) - Ganglionic level infarction (caudate, lentiform nuclei, internal capsule, insula, M1-M3 cortex): 7 - Supraganglionic infarction (M4-M6 cortex): 3 Total score (0-10 with 10 being normal): 10 CTA NECK FINDINGS Aortic dissection is evaluated completely on concomitant CTA chest. RIGHT CAROTID SYSTEM: Skeleton: Normal. Other neck: Unremarkable. LEFT CAROTID SYSTEM: Normal without aneurysm, dissection or stenosis. VERTEBRAL  ARTERIES: Left dominant configuration. Both origins are clearly patent. There is no dissection, occlusion or flow-limiting stenosis to the skull base (V1-V3 segments). CTA HEAD FINDINGS POSTERIOR CIRCULATION: --Vertebral arteries: Normal V4 segments. --Inferior cerebellar arteries: Normal. --Basilar artery: Normal. --Superior cerebellar arteries: Normal. --Posterior cerebral arteries (PCA): Normal. ANTERIOR CIRCULATION: --Intracranial internal carotid arteries: Normal. --Anterior cerebral arteries (ACA): Normal. Both  A1 segments are present. Patent anterior communicating artery (a-comm). --Middle cerebral arteries (MCA): Normal. VENOUS SINUSES: As permitted by contrast timing, patent. ANATOMIC VARIANTS: None Review of the MIP images confirms the above findings. IMPRESSION: 1. No intracranial hemorrhage or mass lesion. ASPECTS is 10. 2. No emergent large vessel occlusion or high-grade stenosis of the intracranial arteries. 3. Aortic dissection, evaluated completely on concomitant CTA chest. These results were called by telephone at the time of interpretation on 05/23/2022 at 8:57 pm to providers [facility] ; [facility] , who verbally acknowledged these results. Electronically Signed   By: Ulyses Jarred M.D.   On: 05/23/2022 20:58    Microbiology: Results for orders placed or performed during the hospital encounter of 05/23/22  SARS Coronavirus 2 by RT PCR (hospital order, performed in [facility] hospital lab) *cepheid single result test* Anterior Nasal Swab     Status: None   Collection Time: 05/24/22  1:06 AM   Specimen: Anterior Nasal Swab  Result Value Ref Range Status   SARS Coronavirus 2 by RT PCR NEGATIVE NEGATIVE Final    Comment: (NOTE) SARS-CoV-2 target nucleic acids are NOT DETECTED.  The SARS-CoV-2 RNA is generally detectable in upper and lower respiratory specimens during the acute phase of infection. The lowest concentration of SARS-CoV-2 viral copies this assay can detect is 250 copies / mL. A negative result does not preclude SARS-CoV-2 infection and should not be used as the sole basis for treatment or other patient management decisions.  A negative result may occur with improper specimen collection / handling, submission of specimen other than nasopharyngeal swab, presence of viral mutation(s) within the areas targeted by this assay, and inadequate number of viral copies (<250 copies / mL). A negative result must be combined with clinical observations, patient history, and  epidemiological information.  Fact Sheet for Patients:   https://www.patel.info/  Fact Sheet for Healthcare Providers: https://hall.com/  This test is not yet approved or  cleared by the [place] FDA and has been authorized for detection and/or diagnosis of SARS-CoV-2 by FDA under an Emergency Use Authorization (EUA).  This EUA will remain in effect (meaning this test can be used) for the duration of the COVID-19 declaration under Section 564(b)(1) of the Act, 21 U.S.C. section 360bbb-3(b)(1), unless the authorization is terminated or revoked sooner.  Performed at [facility], [place] [address], [place], [place] [address]     Labs: CBC: Recent Labs  Lab 05/23/22 2034 05/23/22 2042 05/24/22 0328  WBC 9.3  --  10.1  NEUTROABS 5.8  --   --   HGB 14.6 14.3 14.0  HCT 40.3 42.0 39.9  MCV 91.8  --  94.1  PLT 178  --  [id number]   Basic Metabolic Panel: Recent Labs  Lab 05/23/22 2034 05/23/22 2042 05/24/22 0328  NA 136 139 136  K 4.0 4.1 4.2  CL 102 101 104  CO2 24  --  23  GLUCOSE 231* 229* 172*  BUN 16 17 12   CREATININE 1.04 0.90 1.01  CALCIUM 8.9  --  8.5*   Liver Function Tests: Recent Labs  Lab 05/23/22 2034  AST 22  ALT 22  ALKPHOS 109  BILITOT 0.7  PROT 6.8  ALBUMIN 3.9   CBG: Recent Labs  Lab 05/24/22 1327 05/24/22 1551 05/24/22 2138 05/25/22 0759 05/25/22 1140  GLUCAP 131* 137* 204* 142* 179*    Discharge time spent: greater than 30 minutes.  Signed: Oswald Hillock, MD [facility] 05/25/2022

## 2022-06-05 ENCOUNTER — Telehealth: Payer: Self-pay | Admitting: Neurology

## 2022-06-05 NOTE — Telephone Encounter (Signed)
Patient was admitted for stroke in right brain hemisphere, watershed infarct and d/c on 05-25-2022, the day he received a call from the sleep lab to schedule a sleep study.  His ischemia converted to hemorrhages. Reportedly diagnosed with amyloidosis.hx of atrial fib and AAA.  We wanted to establish a baseline, but may need to change to SPLIT PSG at [facility] 10/h and treat beginning at 9 cm water. IF he is post stroke unable to attend in -lab PSG, will keep him on current CPAP and it's settings and just send ONO while on CPAP for now.    Larey Seat, MD

## 2022-06-09 ENCOUNTER — Ambulatory Visit: Payer: BC Managed Care – PPO | Attending: Family Medicine

## 2022-06-09 ENCOUNTER — Ambulatory Visit: Payer: BC Managed Care – PPO | Admitting: Occupational Therapy

## 2022-06-09 DIAGNOSIS — I639 Cerebral infarction, unspecified: Secondary | ICD-10-CM | POA: Insufficient documentation

## 2022-06-09 DIAGNOSIS — M6281 Muscle weakness (generalized): Secondary | ICD-10-CM | POA: Diagnosis not present

## 2022-06-09 NOTE — Therapy (Signed)
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   repair of torn aorta   HAND SURGERY Left    injury   UMBILICAL HERNIA REPAIR N/A 12/10/2020   Procedure: LAPAROSCOPIC UMBILICAL HERNIA REPAIR WITH MESH;  Surgeon: Jesusita Oka, MD;  Location: [place];  Service: General;  Laterality: N/A;   Patient Active Problem List   Diagnosis Date Noted   Chest pain 05/24/2022   Acute hypoxemic respiratory failure ([place]) 05/24/2022   Emesis 05/24/2022   COPD (chronic obstructive pulmonary disease) ([place]) 05/24/2022   Type 2 diabetes mellitus ([place]) 05/24/2022   Essential hypertension 05/24/2022   Hyperlipidemia 05/24/2022   Acute CVA (cerebrovascular accident) ([place]) 05/23/2022   OSA on CPAP 04/20/2022   Persistent hypersomnia 04/20/2022    ONSET DATE: 05/23/22  REFERRING DIAG: I63.9 Acute ischemic stroke  THERAPY DIAG:  No diagnosis found.  Rationale for Evaluation and Treatment Rehabilitation  SUBJECTIVE:                                                                                                                                                                                              SUBJECTIVE STATEMENT: I feel normal, I do get tired easily but that is about it. I still get numbness into my R arm but it is getting better.   Pt accompanied by: significant other  PERTINENT HISTORY: 51 year old male with medical history of COPD, diabetes mellitus type 2, hypertension, history  of cerebral amyloid angiopathy, history of aortic dissection, s/p repair in 2014.  PAIN:  Are you having pain? No  PRECAUTIONS: None  WEIGHT BEARING RESTRICTIONS No  FALLS: Has patient fallen in last 6 months? No  LIVING ENVIRONMENT: Lives with: lives with their spouse Lives in: House/apartment Stairs: Yes: External: 2 steps; none Has following equipment at home: None  PLOF: Independent  PATIENT GOALS "I don't think I really need any therapy because I feel normal"   OBJECTIVE:   DIAGNOSTIC FINDINGS: IMPRESSION: 1. No acute intracranial abnormality. 2. Scattered chronic microhemorrhages in a peripheral, lobar distribution, which is compatible with amyloid angiopathy.  3. 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  COGNITION: Overall cognitive status: Within functional limits for tasks assessed   SENSATION: WFL  COORDINATION: normal   POSTURE: No Significant postural limitations  LOWER EXTREMITY ROM:   WFL   LOWER EXTREMITY MMT:  5/5 grossly     GAIT: Gait pattern: decreased stride length and trendelenburg Distance walked: in clinic distances Assistive device utilized: None Level of assistance: Complete Independence Comments: no significant gait findings, some slight trunk flexion and slowed speed with gait   FUNCTIONAL TESTs:  5 times sit to stand: 12.72s Timed up and go (TUG): 10.43s Berg Balance Scale: 56/56  TODAY'S TREATMENT:  EVAL and POC   PATIENT EDUCATION: Education details: POC Person educated: Patient and Spouse Education method: Explanation Education comprehension: verbalized understanding  GOALS: Goals reviewed with patient? Yes  SHORT TERM GOALS:   1. Pt will verbalize understanding of role/purpose of physical therapy services and be able to independently identify if/when she may need to seek an add'l referral for services to assist w/ participation in functional activities  Baseline: Goal status: MET   ASSESSMENT:  CLINICAL  IMPRESSION: Evaluate and D/C?   Pt is a 51 y/o who presents to PT due to stroke on 05/23/22. Evaluation indicated that patient has returned to his PLOF and does not have any deficits with functional testing and/or strength and balance. Due to absence of functional deficits skilled physical therapy services are not warranted at this time. PT discussed this w/ pt and wife and they were receptive. Patient was educated about stroke signs and symptoms and the importance of starting a walking program to help decrease risks for another stroke. Pt encouraged to call back with any specific changes or development of limitations during functional activities, as well as to continue follow-up with physician, as needed.?    PLAN:  Skilled PT services are not warranted at this time; pt encouraged to call back with any changes in functional status or limitations.      [place], PT 06/09/2022, 9:21 AM

## 2022-07-06 NOTE — Progress Notes (Unsigned)
Electrophysiology Office Note:    Date:  07/06/2022   ID:  Patrick Woodard, DOB [birth year], MRN [id number]  PCP:  Gifford Shave, MD  [facility] Cardiologist:  None  [facility] Electrophysiologist:  None   Referring MD: Gifford Shave, MD   Chief Complaint: AF  History of Present Illness:    Patrick Woodard is a 51 y.o. male who presents for an evaluation of AF at the request of Dr Erlinda Hong. Their medical history includes COPD, DM, HTN, cerebral amyloid angiopathy, aortic dissection s/p repair in 2014, AF s/p prior ablation in 2013, recent stroke.      Past Medical History:  Diagnosis Date   Aortic dissection, thoracic ([facility])    COPD (chronic obstructive pulmonary disease) ([place])    Diabetes mellitus without complication ([place])    patient denies   Dyspnea    with activity -    Heart murmur    Hypertension     Past Surgical History:  Procedure Laterality Date   CORONARY ARTERY BYPASS GRAFT  07/2013   repair of torn aorta   HAND SURGERY Left    injury   UMBILICAL HERNIA REPAIR N/A 12/10/2020   Procedure: LAPAROSCOPIC UMBILICAL HERNIA REPAIR WITH MESH;  Surgeon: Jesusita Oka, MD;  Location: [place] [facility];  Service: General;  Laterality: N/A;    Current Medications: No outpatient medications have been marked as taking for the 07/07/22 encounter (Office Visit) with Vickie Epley, MD.     Allergies:   Latex   Social History   Socioeconomic History   Marital status: Married    Spouse name: Patrick Woodard   Number of children: 2   Years of education: Not on file   Highest education level: Some college, no degree  Occupational History   Not on file  Tobacco Use   Smoking status: Former    Types: Cigarettes    Quit date: 1992    Years since quitting: 31.8   Smokeless tobacco: Never   Tobacco comments:    smoked a little as a teenager  Media planner   Vaping Use: Never used  Substance and Sexual Activity   Alcohol use: Not Currently   Drug use: Not Currently   Sexual activity: Not  on file  Other Topics Concern   Not on file  Social History Narrative   Lives at home with wife    L handed   Caffeine: rare   Social Determinants of Health   Financial Resource Strain: Not on file  Food Insecurity: No Food Insecurity (05/24/2022)   Hunger Vital Sign    Worried About Running Out of Food in the Last Year: Never true    Ran Out of Food in the Last Year: Never true  Transportation Needs: No Transportation Needs (05/24/2022)   PRAPARE - Hydrologist (Medical): No    Lack of Transportation (Non-Medical): No  Physical Activity: Not on file  Stress: Not on file  Social Connections: Not on file     Family History: The patient's family history includes Aortic dissection in his father; Heart Problems in his mother and paternal aunt; High blood pressure in his mother.  ROS:   Please see the history of present illness.    All other systems reviewed and are negative.  EKGs/Labs/Other Studies Reviewed:    The following studies were reviewed today:  05/25/2022 echo EF 50 RV normal Trivial MR Mild AI     Recent Labs: 04/20/2022: TSH 0.799 05/23/2022: ALT 22 05/24/2022:  BUN 12; Creatinine, Ser 1.01; Hemoglobin 14.0; Platelets 161; Potassium 4.2; Sodium 136  Recent Lipid Panel    Component Value Date/Time   CHOL 93 05/25/2022 0351   TRIG 149 05/25/2022 0351   HDL 23 (L) 05/25/2022 0351   CHOLHDL 4.0 05/25/2022 0351   VLDL 30 05/25/2022 0351   LDLCALC 40 05/25/2022 0351    Physical Exam:    VS:  There were no vitals taken for this visit.    Wt Readings from Last 3 Encounters:  05/24/22 215 lb (97.5 kg)  04/20/22 219 lb (99.3 kg)  05/28/21 214 lb (97.1 kg)     GEN: *** Well nourished, well developed in no acute distress HEENT: Normal NECK: No JVD; No carotid bruits LYMPHATICS: No lymphadenopathy CARDIAC: ***RRR, no murmurs, rubs, gallops RESPIRATORY:  Clear to auscultation without rales, wheezing or rhonchi  ABDOMEN: Soft,  non-tender, non-distended MUSCULOSKELETAL:  No edema; No deformity  SKIN: Warm and dry NEUROLOGIC:  Alert and oriented x 3 PSYCHIATRIC:  Normal affect       ASSESSMENT:    1. Atrial fibrillation, unspecified type ([place])   2. Acute CVA (cerebrovascular accident) ([place])   3. OSA on CPAP   4. Cerebral amyloid angiopathy (H[facility])    PLAN:    In order of problems listed above:  #Atrial fibrillation #CVA   -----------  I have seen [name] in the office today who is being considered for a Watchman left atrial appendage closure device. I believe they will benefit from this procedure given their history of atrial fibrillation, CHA2DS2-VASc score of 4 and unadjusted ischemic stroke rate of 4.8% per year. Unfortunately, the patient is not felt to be a long term anticoagulation candidate secondary to cerebral amyloid angiopathy and history of stroke. The patient's chart has been reviewed and I feel that they would be a candidate for short term oral anticoagulation after Watchman implant.   It is my belief that after undergoing a LAA closure procedure, [name] will not need long term anticoagulation which eliminates anticoagulation side effects and major bleeding risk.   Procedural risks for the Watchman implant have been reviewed with the patient including a 0.5% risk of stroke, <1% risk of perforation and <1% risk of device embolization. Other risks include bleeding, vascular damage, tamponade, worsening renal function, and death. The patient understands these risk and wishes to proceed.     The published clinical data on the safety and effectiveness of WATCHMAN include but are not limited to the following: - Holmes DR, Mechele Claude, Sick P et al. for the PROTECT AF Investigators. Percutaneous closure of the left atrial appendage versus warfarin therapy for prevention of stroke in patients with atrial fibrillation: a randomised non-inferiority trial. Lancet 2009; 374: 534-42. Mechele Claude, Doshi  SK, Abelardo Diesel D et al. on behalf of the PROTECT AF Investigators. Percutaneous Left Atrial Appendage Closure for Stroke Prophylaxis in Patients With Atrial Fibrillation 2.3-Year Follow-up of the PROTECT AF (Watchman Left Atrial Appendage System for Embolic Protection in Patients With Atrial Fibrillation) Trial. Circulation 2013; 127:720-729. - Alli O, Doshi S,  Kar S, Reddy VY, Sievert H et al. Quality of Life Assessment in the Randomized PROTECT AF (Percutaneous Closure of the Left Atrial Appendage Versus Warfarin Therapy for Prevention of Stroke in Patients With Atrial Fibrillation) Trial of Patients at Risk for Stroke With Nonvalvular Atrial Fibrillation. [facility] Cardiol 2013; [id number]. - [place], Sheryle Spray, [place], [place], Governor Specking  V. Prospective randomized evaluation of the Watchman left atrial appendage Device in patients with atrial fibrillation versus long-term warfarin therapy; the PREVAIL trial. Journal of the [employer] of Cardiology, Vol. 4, No. 1, 2014, 1-11. - Kar S, Doshi SK, Sadhu A, Horton R, Osorio J et al. Primary outcome evaluation of a next-generation left atrial appendage closure device: results from the PINNACLE FLX trial. Circulation 2021;143(18)1754-1762.    After today's visit with the patient which was dedicated solely for shared decision making visit regarding LAA closure device, the patient decided to proceed with the LAA appendage closure procedure scheduled to be done in the near future at [facility]. Prior to the procedure, I would like to obtain a gated CT scan of the chest with contrast timed for PV/LA visualization.     HAS-BLED score 3 Hypertension Yes  Abnormal renal and liver function (Dialysis, transplant, Cr >2.26 mg/dL /Cirrhosis or Bilirubin >2x Normal or AST/ALT/AP >3x Normal) No  Stroke Yes  Bleeding Yes  Labile INR (Unstable/high INR) No  Elderly (>65) No  Drugs or alcohol (? 8 drinks/week, anti-plt  or NSAID) No   CHA2DS2-VASc Score = 4  The patient's score is based upon: CHF History: 0 HTN History: 1 Diabetes History: 0 Stroke History: 2 Vascular Disease History: 1 Age Score: 0 Gender Score: 0   ***DAPT implant***   Total time spent with patient today *** minutes. This includes reviewing records, evaluating the patient and coordinating care.  Medication Adjustments/Labs and Tests Ordered: Current medicines are reviewed at length with the patient today.  Concerns regarding medicines are outlined above.  No orders of the defined types were placed in this encounter.  No orders of the defined types were placed in this encounter.    Signed, Hilton Cork. Quentin Ore, MD, [facility], [facility] 07/06/2022 9:28 PM    Electrophysiology [facility]

## 2022-07-07 ENCOUNTER — Ambulatory Visit: Payer: BC Managed Care – PPO | Attending: Cardiology | Admitting: Cardiology

## 2022-07-07 VITALS — BP 96/58 | HR 70 | Ht 71.5 in | Wt 207.0 lb

## 2022-07-07 DIAGNOSIS — I4891 Unspecified atrial fibrillation: Secondary | ICD-10-CM | POA: Diagnosis not present

## 2022-07-07 DIAGNOSIS — G4733 Obstructive sleep apnea (adult) (pediatric): Secondary | ICD-10-CM

## 2022-07-07 DIAGNOSIS — I680 Cerebral amyloid angiopathy: Secondary | ICD-10-CM

## 2022-07-07 DIAGNOSIS — E854 Organ-limited amyloidosis: Secondary | ICD-10-CM | POA: Diagnosis not present

## 2022-07-07 DIAGNOSIS — I639 Cerebral infarction, unspecified: Secondary | ICD-10-CM

## 2022-07-07 DIAGNOSIS — Z79899 Other long term (current) drug therapy: Secondary | ICD-10-CM

## 2022-07-07 NOTE — Progress Notes (Signed)
Electrophysiology Office Note:    Date:  07/07/2022   ID:  Patrick Woodard, DOB [birth year], MRN [id number]  PCP:  Patrick Shave, MD  [facility] Cardiologist:  None  [facility] Electrophysiologist:  Patrick Epley, MD   Referring MD: Patrick Shave, MD   Chief Complaint: AF  History of Present Illness:    Patrick Woodard is a 51 y.o. male who presents for an evaluation of AF at the request of Dr Patrick Woodard. Their medical history includes COPD, DM, HTN, cerebral amyloid angiopathy, aortic dissection s/p repair in 2014, AF s/p prior ablation in 2013, recent stroke.  Today, he is accompanied by his wife. After his recent stroke he has been experiencing some weakness in his arm, but other than that has been recovering well. He is compliant with his medications and denies having any issues with them.   He denies any palpitations, chest pain, shortness of breath, or peripheral edema. No lightheadedness, headaches, syncope, orthopnea, or PND.  During his previous aortic dissection repair, his left atrial appendage was clipped.    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   Armodafinil 150 MG tablet Take 1 tablet (150 mg total) by mouth daily.   aspirin 81 MG chewable tablet Chew 1 tablet (81 mg  total) by mouth daily.   atorvastatin (LIPITOR) 10 MG tablet Take 10 mg by mouth daily.   clopidogrel (PLAVIX) 75 MG tablet Take 1 tablet (75 mg total) by mouth daily.   loratadine (CLARITIN) 10 MG tablet Take 10 mg by mouth daily.   metFORMIN (GLUCOPHAGE-XR) 500 MG 24 hr tablet Take 500 mg by mouth daily.   metoprolol tartrate (LOPRESSOR) 25 MG tablet Take 1.5 tablets by mouth 2 (two) times daily.   montelukast (SINGULAIR) 10 MG tablet Take 10 mg by mouth daily.   Semaglutide (RYBELSUS) 3 MG TABS Take 3 mg by mouth daily.   sildenafil (VIAGRA) 100 MG tablet Take 100 mg by mouth at bedtime as needed for erectile dysfunction.   sotalol (BETAPACE) 80 MG tablet Take 80 mg by mouth 2 (two) times daily.     Allergies:   Latex   Social History   Socioeconomic History   Marital status: Married    Spouse name: Patrick Woodard   Number of children: 2   Years of education: Not on file   Highest education level: Some college, no degree  Occupational History   Not on file  Tobacco Use   Smoking status: Former    Types: Cigarettes    Quit date: 1992    Years since quitting: 31.8   Smokeless tobacco: Never   Tobacco comments:    smoked a little as a teenager  Media pl[employer]  Vaping Use: Never used  Substance and Sexual Activity   Alcohol use: Not Currently   Drug use: Not Currently   Sexual activity: Not on file  Other Topics Concern   Not on file  Social History Narrative   Lives at home with wife    L handed   Caffeine: rare   Social Determinants of Health   Financial Resource Strain: Not on file  Food Insecurity: No Food Insecurity (05/24/2022)   Hunger Vital Sign    Worried About Running Out of Food in the Last Year: Never true    [place] in the Last Year: Never true  Transportation Needs: No Transportation Needs (05/24/2022)   PRAPARE - Hydrologist (Medical): No    Lack of Transportation (Non-Medical): No  Physical Activity: Not on file  Stress:  Not on file  Social Connections: Not on file     Family History: The patient's family history includes Aortic dissection in his father; Heart Problems in his mother and paternal aunt; High blood pressure in his mother.  ROS:   Please see the history of present illness.    All other systems reviewed and are negative.  EKGs/Labs/Other Studies Reviewed:    The following studies were reviewed today:  05/25/2022 echo EF 50 RV normal Trivial MR Mild AI   EKG:  May 23, 2022 EKG shows sinus rhythm, incomplete right bundle branch block, left anterior fascicular block.  QTc of 480 ms.  Recent Labs: 04/20/2022: TSH 0.799 05/23/2022: ALT 22 05/24/2022: BUN 12; Creatinine, Ser 1.01; Hemoglobin 14.0; Platelets 161; Potassium 4.2; Sodium 136  Recent Lipid Panel    Component Value Date/Time   CHOL 93 05/25/2022 0351   TRIG 149 05/25/2022 0351   HDL 23 (L) 05/25/2022 0351   CHOLHDL 4.0 05/25/2022 0351   VLDL 30 05/25/2022 0351   LDLCALC 40 05/25/2022 0351    Physical Exam:    VS:  BP (!) 96/58   Pulse 70   Ht 5' 11.5" (1.816 m)   Wt 207 lb (93.9 kg)   SpO2 95%   BMI 28.47 kg/m     Wt Readings from Last 3 Encounters:  07/07/22 207 lb (93.9 kg)  05/24/22 215 lb (97.5 kg)  04/20/22 219 lb (99.3 kg)     GEN: Well nourished, well developed in no acute distress HEENT: Normal NECK: No JVD; No carotid bruits LYMPHATICS: No lymphadenopathy CARDIAC: RRR, no murmurs, rubs, gallops RESPIRATORY:  Clear to auscultation without rales, wheezing or rhonchi  ABDOMEN: Soft, non-tender, non-distended MUSCULOSKELETAL:  No edema; No deformity  SKIN: Warm and dry NEUROLOGIC:  Alert and oriented x 3 PSYCHIATRIC:  Normal affect       ASSESSMENT:    1. Atrial fibrillation, unspecified type ([place])   2. Acute CVA (cerebrovascular accident) ([place])   3. OSA on CPAP   4. Cerebral amyloid angiopathy ([place])   5. Encounter for long-term (current) use of high-risk medication    PLAN:     In order of problems listed above:  #Atrial fibrillation #CVA  The patient has a history of left atrial appendage clipping at the time of his aortic dissection repair.  He is not a candidate for watchman because of the surgical removal of his left atrial appendage.  He is currently taking aspirin and Plavix for secondary stroke prophylaxis at the recommendation of his neurology team.  Recommend close follow-up with neurology and his primary care physician.  He is currently taking  sotalol 80 mg by mouth twice daily. No evidence of recurrence of his atrial fibrillation after his prior ablation procedure and with the use of sotalol.  Follow-up 6 months with APP.  Medication Adjustments/Labs and Tests Ordered: Current medicines are reviewed at length with the patient today.  Concerns regarding medicines are outlined above.  No orders of the defined types were placed in this encounter.  No orders of the defined types were placed in this encounter.    I,Rachel Rivera,acting as a scribe for Patrick Epley, MD.,have documented all relevant documentation on the behalf of Patrick Epley, MD,as directed by  Patrick Epley, MD while in the presence of Patrick Epley, MD.  I, Patrick Epley, MD, have reviewed all documentation for this visit. The documentation on 07/07/22 for the exam, diagnosis, procedures, and orders are all accurate and complete.   Signed, Hilton Cork. Quentin Ore, MD, [facility], [facility] 07/07/2022 1:34 PM    Electrophysiology [facility]

## 2022-07-07 NOTE — Patient Instructions (Addendum)
Medication Instructions:  No Changes  *If you need a refill on your cardiac medications before your next appointment, please call your pharmacy*   Lab Work: None  If you have labs (blood work) drawn today and your tests are completely normal, you will receive your results only by: [place] (if you have MyChart) OR A paper copy in the mail If you have any lab test that is abnormal or we need to change your treatment, we will call you to review the results.   Testing/Procedures: None    Follow-Up: At [facility], you and your health needs are our priority.  As part of our continuing mission to provide you with exceptional heart care, we have created designated Provider Care Teams.  These Care Teams include your primary Cardiologist (physician) and Advanced Practice Providers (APPs -  Physician Assistants and Nurse Practitioners) who all work together to provide you with the care you need, when you need it.  We recommend signing up for the patient portal called "MyChart".  Sign up information is provided on this After Visit Summary.  MyChart is used to connect with patients for Virtual Visits (Telemedicine).  Patients are able to view lab/test results, encounter notes, upcoming appointments, etc.  Non-urgent messages can be sent to your provider as well.   To learn more about what you can do with MyChart, go to [email or web address].    Your next appointment:   6 months   With Tommye Standard or Oda Kilts   Important Information About Sugar

## 2022-07-08 ENCOUNTER — Ambulatory Visit (INDEPENDENT_AMBULATORY_CARE_PROVIDER_SITE_OTHER): Payer: BC Managed Care – PPO | Admitting: Neurology

## 2022-07-08 ENCOUNTER — Encounter: Payer: Self-pay | Admitting: Neurology

## 2022-07-08 VITALS — BP 129/68 | HR 67 | Ht 71.0 in | Wt 207.0 lb

## 2022-07-08 DIAGNOSIS — I480 Paroxysmal atrial fibrillation: Secondary | ICD-10-CM | POA: Diagnosis not present

## 2022-07-08 DIAGNOSIS — E854 Organ-limited amyloidosis: Secondary | ICD-10-CM | POA: Insufficient documentation

## 2022-07-08 DIAGNOSIS — Z91199 Patient's noncompliance with other medical treatment and regimen due to unspecified reason: Secondary | ICD-10-CM

## 2022-07-08 DIAGNOSIS — I639 Cerebral infarction, unspecified: Secondary | ICD-10-CM | POA: Diagnosis not present

## 2022-07-08 DIAGNOSIS — I71012 Dissection of descending thoracic aorta: Secondary | ICD-10-CM

## 2022-07-08 DIAGNOSIS — I680 Cerebral amyloid angiopathy: Secondary | ICD-10-CM

## 2022-07-08 NOTE — Progress Notes (Signed)
SLEEP MEDICINE CLINIC    Provider:  Larey Seat, MD  Primary Care Physician:  Gifford Shave, MD [place] [address]     Referring Provider: Rosalin Hawking, Md [place],  [place] [address]          Chief Complaint according to patient   Patient presents with:     New Patient (Initial Visit)     CVA- Embolic stroke ? 05-23-2022<  Pt states initially when starting her noticed great improvements now, he only notices a headache when not using. Reports feeling tired. Pt would like to try a different mask. DME [employer]. Pt pt would like to discuss inspire.      HISTORY OF PRESENT ILLNESS:  STROKE FOLLOW UP FOR SLEEP APNEA PATIENT :   I have the pleasure of meeting with Patrick Woodard today and his wife . This is 08 July 2022 and our visit is now not for sleep apnea for which I had seen the patient before but for a recent stroke.  The patient suffered an acute stroke on May 23, 2022 when he was admitted through the [facility] hospitalist service.  He was described as a 51 year old Caucasian male with a history of COPD, type 2 diabetes, hypertension, history of cerebral amyloid angiopathy, history of aortic dissection s/p repair 2014.  The patient had a sudden onset of chest pain for which EMS was called.  Upon their arrival they noticed he had profound right-sided weakness with right hemianopsia and extinction to sensory.  A code stroke was activated the weakness resolved in 10 minutes and his sensory extinction had improved by the time he reached the emergency room.  He had persistent right hemianopsia however.  His CT was negative for bleed a CTA was negative for large vessel occlusion, and with his history of aortic dissection and now with chest pain a CT study was done the results were discussed with the cardiothoracic surgeon who felt there was no dissection at this time going on.  A brain MRI was then finally done and showed left hemispheric watershed  and few infarction.  A clot Buster was not given due to more than 10 microhemorrhages surrounding the imaging side and the reported history of cerebral amyloid angiopathy.  Lab tests were quoted below the patient was given Ativan prior to the MRI due to claustrophobia he vomited multiple times needed Zofran and was finally also placed on 2 L of supplemental oxygen as his oxygenation dropped to the 80s.  He said the onset of right-sided chest pain was right after he was leaving a restaurant and he felt that his right arm went numb.  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  I also reviewed his CPAP compliance and that is spotty , he did not use his CPAP since ED visit.    SLEEP CLINIC:  Seen here upon referral on 04/20/2022 from PCP/ occupational health MD for a new evaluation of [name] , [name] was originally seen by [facility] on 01-03-2017 , and prescribed CPAP with a FFM-  He was given a [employer] which was later recalled  and replaced by a refurbished machine of unknown age in 2021-DME was [employer].  Sleep relevant medical/ surgical history or symptom history: Nocturia none , no Tonsillectomy: , no cervical spine injury, no deviated septum, some SOB.   Patrick Woodard is a 51 yo Caucasian male patient presenting with a past medical history of COPD (chronic obstructive pulmonary disease) ([place]), Diabetes mellitus ([place]), Dyspnea, Heart murmur, and Hypertension.n April 2018 he was evaluated by a split-night polysomnography at [facility] which was interpreted by Dr. Baird Lyons.  He had mild sleep apnea but the AHI for the split was set low and therefore he was titrated the same night.   After he left the sleep lab he received a [employer] auto titration device which was set at 13 cm water.  He has continued to use his machine with good compliance of about 77% average user time is 4-1/2 hours at night but he does not feel that it has the same restorative and refreshing quality reduced to half.  His average residual AHI or apnea hypopnea index per hour of sleep is 1.7 which is a good resolution he is using an ramp of 20 minutes, starting at 4 cmH2O heated tubing.  Humidification is at level 3.  The patient endorsed a very high degree of residual sleepiness today 16 out of 24 points and he clearly noted that his CPAP is helping him to sleep better however it used to have an even more significant effect.   Residual Epworth sleepiness score of 16 is considered excessively daytime sleepy (EDS) and I think that this patient needs to be evaluated for the degree of apnea again.   I wonder if he has hypoxemia or central apneas arising.   We also need to get a baseline in case that he qualifies for an inspire device as he is as he is interested in.  His original sleep study mentions no significant snoring which is suspicious to me and I do not think that for patient with a residual AHI of 1.7/h there should any persistent hypersomnia related to apnea be present.   So the question is also all the other causes of this degree of sleepiness.  I will order the baseline sleep study and new machine would be needed anyway should he decide to decide to continue with CPAP.    I have to explain to the patient that in case of : central apneas, significant oxygen desaturations or REM sleep dependent apnea an inspire device would not be used. The same is true for a dental device.     The patient had this SPLIT  sleep study at [facility] in 4 -2018 : Results: - Mild obstructive sleep apnea occurred during the diagnostic portion of the study (AHI = 11.5 /hour). An optimal PAP pressure was selected for this  patient ( 13 cm of water) - Mild central sleep apnea occurred during the diagnostic portion of the study (CAI = 6.2/hour). - The patient had minimal  oxygen desaturation during the diagnostic portion of the study (Min O2 = 85.00%) - No snoring was audible during . - EKG findings include PVCs. - No clinically significant periodic limb movements.   DIAGNOSIS - Obstructive Sleep Apnea (327.23 [G47.33 ICD-10])   RECOMMENDATIONS - Trial of CPAP therapy on 13 cm H2O with a Medium size [employer] Full Face Mask Simplus mask and heated humidification. - Avoid alcohol, sedatives and other CNS depressants that may worsen sleep apnea and disrupt normal sleep architecture. - Sleep hygiene  should be reviewed to assess factors that may improve sleep quality. - Weight management and regular exercise should be initiated or continued.   [Electronically signed] 01/03/2017 11:26 AM  Jetty Duhamellinton Young MD, ABSM Diplomate, American Board of Sleep Medicine NPI: [id number]   Waymon BudgeYOUNG,CLINTON D Diplomate, American Board of Sleep Medicine   ELECTRONICALLY SIGNED ON:  01/03/2017, 11:25 AM [facility] PH: [phone]   FX: [phone] ACCREDITED BY THE AMERICAN ACADEMY OF SLEEP MEDICINE      Family medical /sleep history: Father with OSA.  Diagnoses   Aortic dissection, thoracic (HCC)  , affecting patient, his father and 2 paternal aunts - both died due to A dissection.      Social history:  Patient is working at [employer], mostly day time.   and lives in a household with spouse. Tobacco use: none.  ETOH use/ 3 drinks a year, seldomly Caffeine intake in form of Coffee( rarely ).NO enegy drinks, tea or soda Regular exercise none .   Hobbies : golfing.    Sleep habits are as follows: The patient's dinner time is between 6.30-7 PM. The patient goes to bed at 9.30 PM and continues to sleep for 6-7 hours, wakes for none or one bathroom break- and reportedly sleeps  poorly without CPAP.  The preferred sleep position is sideways , with the support of 1 CPAP pillows. Dreams are reportedly rare.  4.15   AM is the usual rise time. The patient wakes up with an alarm. Early shift  He reports not feeling refreshed or restored in AM, with symptoms such as dry mouth on CPAP but no longer morning headaches, and residual fatigue.  Naps are taken infrequently.  Review of Systems: Out of a complete 14 system review, the patient complains of only the following symptoms, and all other reviewed systems are negative.:   Newly diagnosed diabetic,  right shoulder and upper arm tingling,    Fatigue, sleepiness , snoring without CPAP.   Epworth sleepiness score:    Total = 16/ 24 points   FSS endorsed at 35/ 63 points.   Social History   Socioeconomic History   Marital status: Married    Spouse name: Patrick Woodard   Number of children: 2   Years of education: Not on file   Highest education level: Some college, no degree  Occupational History   Not on file  Tobacco Use   Smoking status: Former    Types: Cigarettes    Quit date: 1992    Years since quitting: 31.8   Smokeless tobacco: Never   Tobacco comments:    smoked a little as a teenager  Building services engineerVaping Use   Vaping Use: Never used  Substance and Sexual Activity   Alcohol use: Not Currently   Drug use: Not Currently   Sexual activity: Not on file  Other Topics Concern   Not on file  Social History Narrative   Lives at home with wife    L handed   Caffeine: rare   Social Determinants of Health   Financial Resource Strain: Not on file  Food Insecurity: No Food Insecurity (05/24/2022)   Hunger Vital Sign    Worried About Running Out of Food in the Last Year: Never true    Ran Out of Food in the Last Year: Never true  Transportation Needs: No Transportation Needs (05/24/2022)   PRAPARE - Administrator, Civil ServiceTransportation    Lack of Transportation (Medical): No    Lack of Transportation (Non-Medical): No  Physical Activity:  Not on file  Stress: Not on file  Social Connections: Not on file    Family History  Problem Relation Age of Onset   High blood pressure Mother    Heart Problems Mother    Aortic dissection Father        x2   Heart Problems Paternal Aunt     Past Medical History:  Diagnosis Date   Aortic dissection, thoracic ([place])    COPD (chronic obstructive pulmonary disease) ([place])    Diabetes mellitus without complication ([place])    patient denies   Dyspnea    with activity -    Heart murmur    Hypertension     Past Surgical History:  Procedure Laterality Date   CORONARY ARTERY BYPASS GRAFT  07/2013   repair of torn aorta   HAND SURGERY Left    injury   UMBILICAL HERNIA REPAIR N/A 12/10/2020   Procedure: LAPAROSCOPIC UMBILICAL HERNIA REPAIR WITH MESH;  Surgeon: Jesusita Oka, MD;  Location: [facility];  Service: General;  Laterality: N/A;     Current Outpatient Medications on File Prior to Visit  Medication Sig Dispense Refill   amoxicillin-clavulanate (AUGMENTIN) 875-125 MG tablet Take 1 tablet by mouth 2 (two) times daily. 8 tablet 0   Armodafinil 150 MG tablet Take 1 tablet (150 mg total) by mouth daily. 30 tablet 5   aspirin 81 MG chewable tablet Chew 1 tablet (81 mg total) by mouth daily. 30 tablet 3   atorvastatin (LIPITOR) 10 MG tablet Take 10 mg by mouth daily.     clopidogrel (PLAVIX) 75 MG tablet Take 1 tablet (75 mg total) by mouth daily. 30 tablet 3   loratadine (CLARITIN) 10 MG tablet Take 10 mg by mouth daily.     metFORMIN (GLUCOPHAGE-XR) 500 MG 24 hr tablet Take 500 mg by mouth daily.     metoprolol tartrate (LOPRESSOR) 25 MG tablet Take 1.5 tablets by mouth 2 (two) times daily.     montelukast (SINGULAIR) 10 MG tablet Take 10 mg by mouth daily.     Semaglutide (RYBELSUS) 3 MG TABS Take 3 mg by mouth daily.     sildenafil (VIAGRA) 100 MG tablet Take 100 mg by mouth at bedtime as needed for erectile dysfunction.     sotalol (BETAPACE) 80 MG tablet Take 80 mg by mouth 2  (two) times daily.     No current facility-administered medications on file prior to visit.    Allergies  Allergen Reactions   Latex Dermatitis and Rash    Physical exam:  Today's Vitals   07/08/22 1454  BP: 129/68  Pulse: 67  Weight: 207 lb (93.9 kg)  Height: 5\' 11"  (1.803 m)   Body mass index is 28.87 kg/m.   Wt Readings from Last 3 Encounters:  07/08/22 207 lb (93.9 kg)  07/07/22 207 lb (93.9 kg)  05/24/22 215 lb (97.5 kg)     Ht Readings from Last 3 Encounters:  07/08/22 5\' 11"  (1.803 m)  07/07/22 5' 11.5" (1.816 m)  05/24/22 6' (1.829 m)      General: The patient is awake, alert and appears not in acute distress. The patient is well groomed. Head: Normocephalic, atraumatic. Neck is supple.  Mallampati 1,  neck circumference:17 inches . Nasal airflow slightly impaired-  but patent.  Retrognathia is not seen.  Dental status: biological teeth.  Cardiovascular:  Regular rate rhythm by pulse,  no distended neck veins. Respiratory: Lungs are clear to auscultation.  Skin:  Without evidence of ankle edema, or rash. Trunk: The patient's posture is erect.   Neurologic exam : The patient is awake and alert, oriented to place and time.   Memory subjective described as intact.  Attention span & concentration ability appears normal.  Speech is fluent,  without dysarthria, dysphonia or aphasia.  Mood and affect are appropriate.   Cranial nerves: no loss of smell or taste reported  Pupils are equal and briskly reactive to light. Funduscopic exam deferred.   Extraocular movements in vertical and horizontal planes were intact- without nystagmus. HE HAS NO HEMIANOPSIA NOW_ and  No Diplopia. Visual fields by finger perimetry are intact. Hearing was intact to soft voice and finger rubbing.    Facial sensation intact to fine touch.Facial motor strength is symmetric and tongue and uvula move midline.  Neck ROM : rotation, tilt and flexion extension were normal for age and  shoulder shrug was symmetrical.    Motor exam:  Symmetric bulk, tone and ROM.   Normal tone without cog -wheeling, symmetric grip strength .   Sensory:  Fine touch and vibration were tested  and  normal.  Proprioception tested in the upper extremities was normal.   Coordination: Rapid alternating movements in the fingers/hands were of normal speed.  The Finger-to-nose maneuver was intact without evidence of ataxia, dysmetria or tremor.   Gait and station: Patient could rise unassisted from a seated position, walked without assistive device.  Deep tendon reflexes: in the upper and lower extremities are symmetric and intact.  Babinski response was deferred.    Summary: I have the pleasure of meeting Mr. Patrick Woodard today, who was  new to our sleep clinic as of august and suffered a stroke in September 19th 20023, which necessitated todays visit. His ordered sleep study was not yet performed.     After spending a total time of  35  minutes face to face and additional time for physical and neurologic examination, review of laboratory studies,  personal review of imaging studies, reports and results of other testing and review of referral information / records as far as provided in visit, I have established the following assessments:  1) new CVA  as documented on MRI brain. 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  2) history of AAA ! Repair and open heart surgery.    My Plan is to proceed with: 0)  1) new baseline sleep study. Still pending  2) Rv in 2-3 months again to discuss treatment. 3) Modafinil ordered for Hypersomnia excessive daytime sleepiness.   I would like to thank Gifford Shave, MD and Rosalin Hawking, Md [address] [place],  [place] [address] for allowing me to meet with and to take care of this pleasant patient.   In short, Yoseph Haile is presenting with a possible new stroke - the MRI mentioned only  remote findings and fresh amyloid and angiopathic bleeds-with  atrial fibrillation history and no evidence of cardiac blood clot.  I will order an transesophageal echo and a possible bubble study.    For his persistent Hypersomnia will repeat sleep study as ordered and pending.  while he is sleeping much better on CPAP - and he is tolerating CPAP well.  I plan to follow up either personally or through our NP within 3 months after his sleep study  Originally meant for Sleep Medicine only. I need to find out how old his  current CPAP machine truly is- over 5 years according to wife.  DME: [employer] Set up on:11/20/2020 [facility]    Electronically signed by: Larey Seat, MD 07/08/2022 3:20 PM  [facility] Board certified by The [employer] of Sleep Medicine and Diplomate of the [employer] of Sleep Medicine. Board certified In Neurology through the [place], Fellow of the [employer] of Neurology. Medical Director of [employer].

## 2022-07-22 DIAGNOSIS — Z0289 Encounter for other administrative examinations: Secondary | ICD-10-CM

## 2022-07-23 ENCOUNTER — Ambulatory Visit (HOSPITAL_COMMUNITY)
Admission: RE | Admit: 2022-07-23 | Discharge: 2022-07-23 | Disposition: A | Discharge status: Home / Self Care | Payer: BC Managed Care – PPO | Source: Ambulatory Visit | Attending: Neurology | Admitting: Neurology

## 2022-07-23 DIAGNOSIS — E854 Organ-limited amyloidosis: Secondary | ICD-10-CM

## 2022-07-23 DIAGNOSIS — I639 Cerebral infarction, unspecified: Secondary | ICD-10-CM | POA: Diagnosis not present

## 2022-07-23 DIAGNOSIS — I71012 Dissection of descending thoracic aorta: Secondary | ICD-10-CM | POA: Diagnosis present

## 2022-07-23 DIAGNOSIS — I10 Essential (primary) hypertension: Secondary | ICD-10-CM | POA: Insufficient documentation

## 2022-07-23 DIAGNOSIS — I083 Combined rheumatic disorders of mitral, aortic and tricuspid valves: Secondary | ICD-10-CM | POA: Insufficient documentation

## 2022-07-23 DIAGNOSIS — I680 Cerebral amyloid angiopathy: Secondary | ICD-10-CM | POA: Diagnosis not present

## 2022-07-23 DIAGNOSIS — E119 Type 2 diabetes mellitus without complications: Secondary | ICD-10-CM | POA: Diagnosis not present

## 2022-07-23 DIAGNOSIS — I6389 Other cerebral infarction: Secondary | ICD-10-CM

## 2022-07-23 DIAGNOSIS — Z8679 Personal history of other diseases of the circulatory system: Secondary | ICD-10-CM | POA: Insufficient documentation

## 2022-07-23 LAB — ECHOCARDIOGRAM COMPLETE BUBBLE STUDY
Area-P 1/2: 4.17 cm2
Calc EF: 56.4 %
S' Lateral: 3.9 cm
Single Plane A2C EF: 65.5 %
Single Plane A4C EF: 45.3 %

## 2022-07-23 MED ORDER — PERFLUTREN LIPID MICROSPHERE
1.0000 mL | INTRAVENOUS | Status: AC | PRN
  Administered 2022-07-23: 3 mL via INTRAVENOUS

## 2022-07-27 ENCOUNTER — Telehealth: Payer: Self-pay

## 2022-07-27 NOTE — Telephone Encounter (Signed)
-----   Message from Melvyn Novas, MD sent at 07/24/2022 12:15 PM EST ----- Echocardiogram with [facility]: The result is normal, normal ejection function, no shunt was seen ( such as in a persistent foramen ovale) . Pulmonary artery pressure was not determined.

## 2022-07-27 NOTE — Progress Notes (Signed)
Called pt and LVM. SB,CMA

## 2022-07-27 NOTE — Progress Notes (Signed)
Called pt and LVM, SB,CMA

## 2022-07-28 ENCOUNTER — Telehealth: Payer: Self-pay | Admitting: *Deleted

## 2022-07-28 NOTE — Telephone Encounter (Signed)
Pt [employer] form faxed on 07/27/22.

## 2022-09-02 IMAGING — CT CT HEAD W/O CM
3 series · 15 of 47 positions shown, 18 images · non-contrast
Comparison: Report from head CT and brain MRI 04/19/2018

CLINICAL DATA: Head trauma, mod-severe

EXAM:
CT HEAD WITHOUT CONTRAST
TECHNIQUE: Contiguous axial images were obtained from the base of the skull
through the vertex without intravenous contrast.

[Series 2: head wo · axial · 0.45mm/px · z∈[-220,-74]mm · 9 of 35 slices shown, 12 images]
[im 3/35  brain]
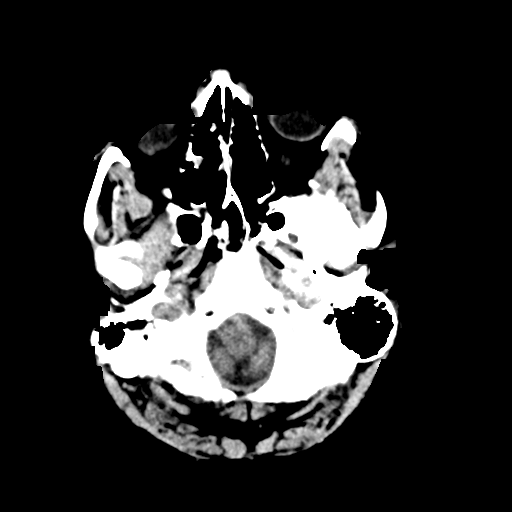
[im 3/35  bone]
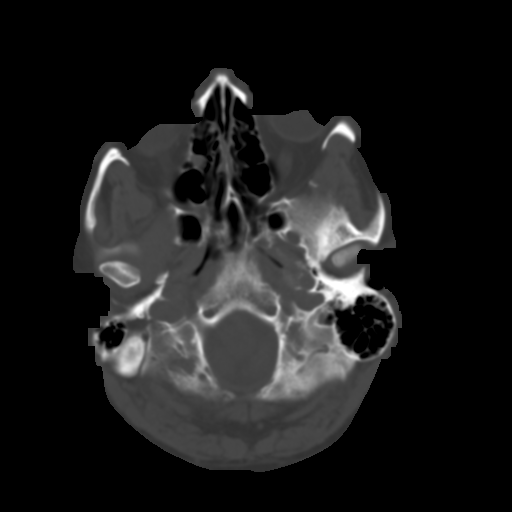
[im 6/35  brain]
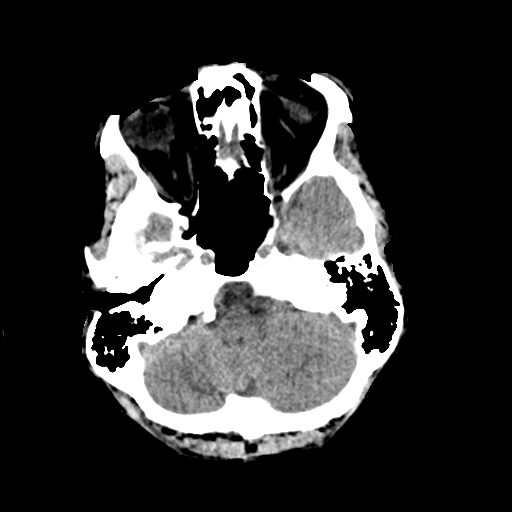
[im 10/35  brain]
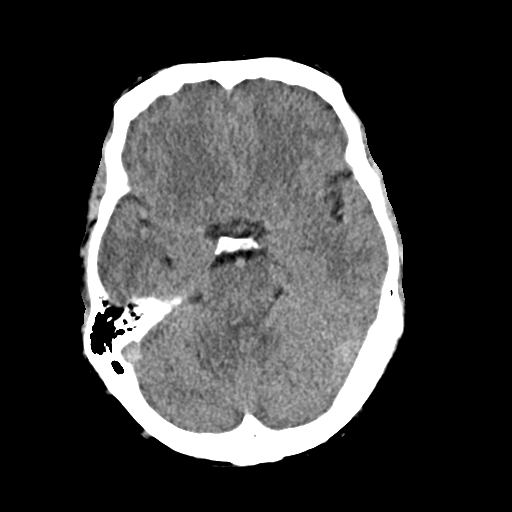
[im 13/35  brain]
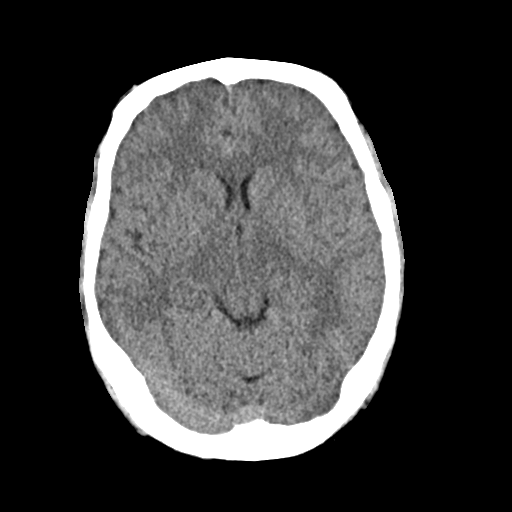
[im 18/35  brain]
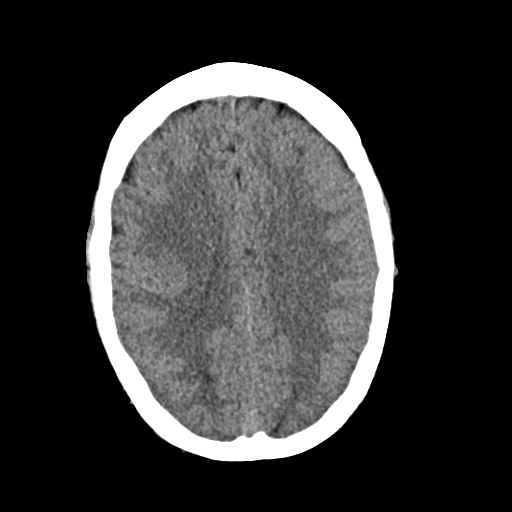
[im 18/35  bone]
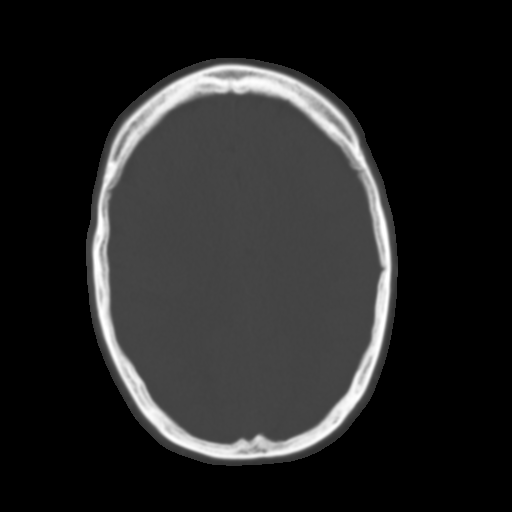
[im 22/35  brain]
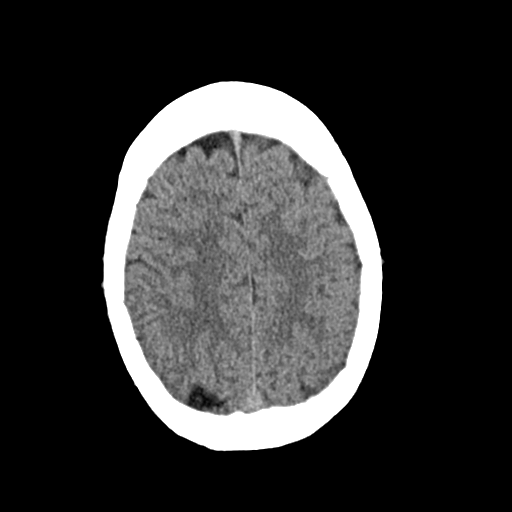
[im 25/35  brain]
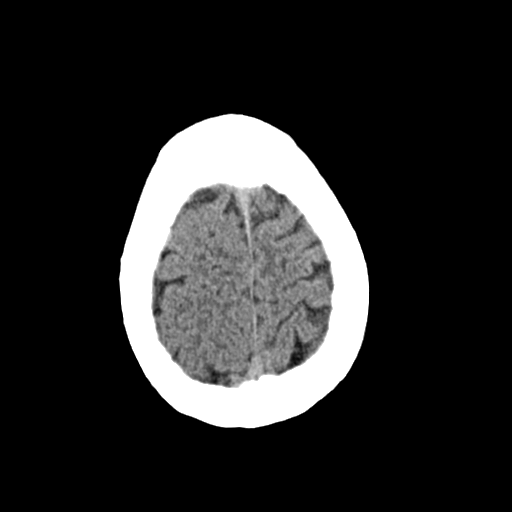
[im 29/35  brain]
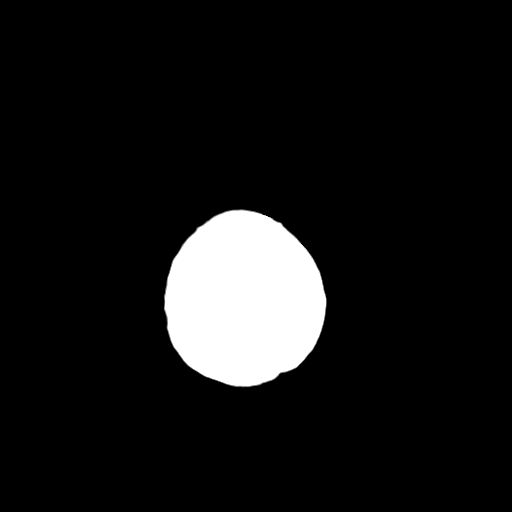
[im 32/35  brain]
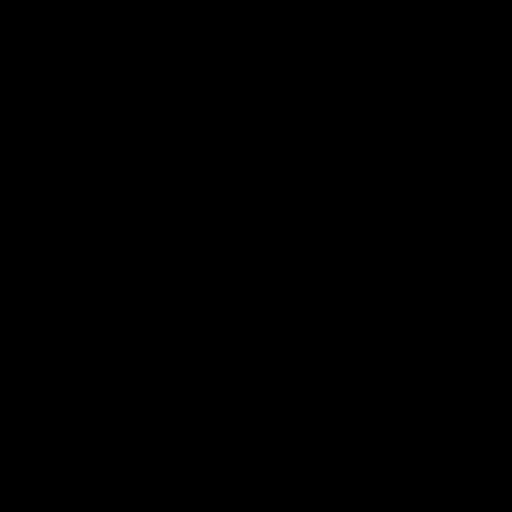
[im 32/35  bone]
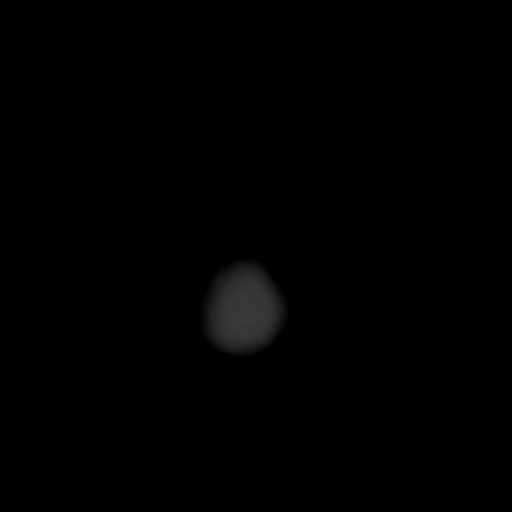

[Series 4: coronal soft · coronal · 0.33mm/px · 3 of 73 slices shown]
[im 25/73  brain]
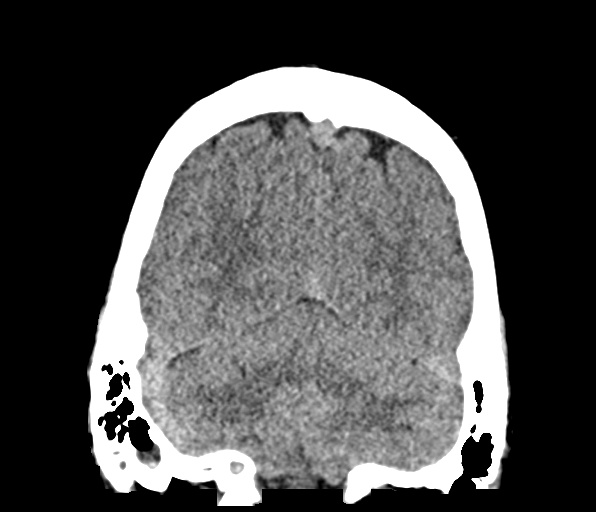
[im 33/73  brain]
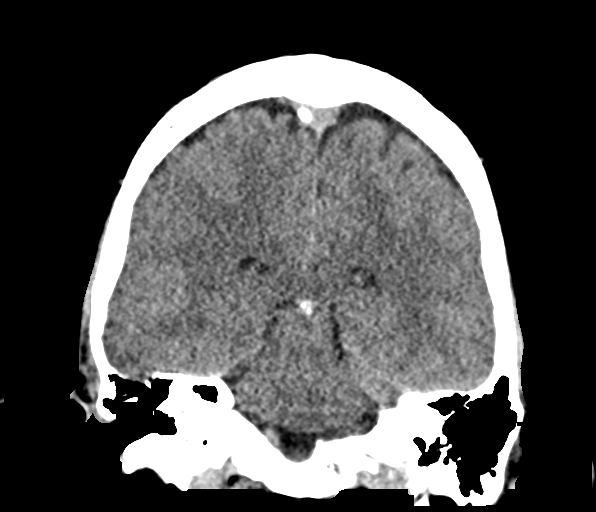
[im 41/73  brain]
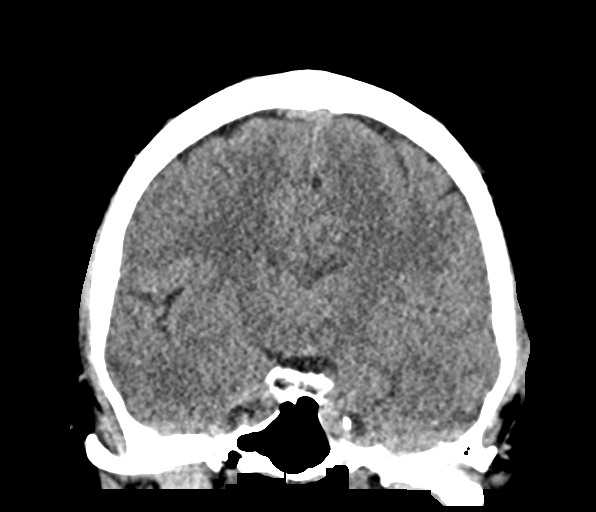

[Series 5: sag soft · sagittal · 0.33mm/px · 3 of 67 slices shown]
[im 23/67  brain]
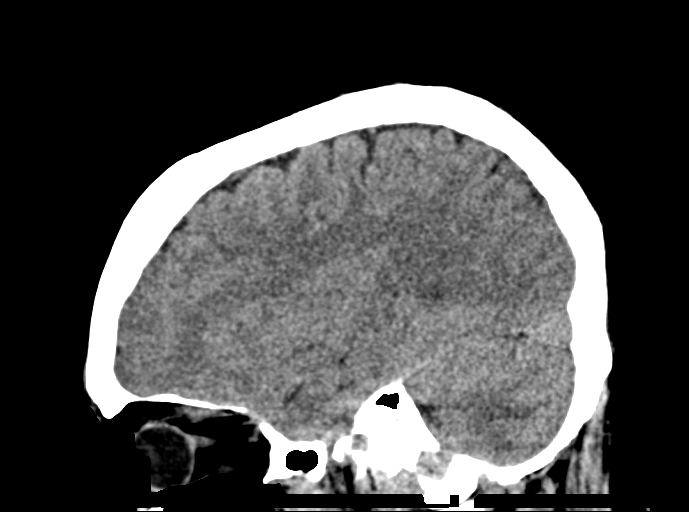
[im 34/67  brain]
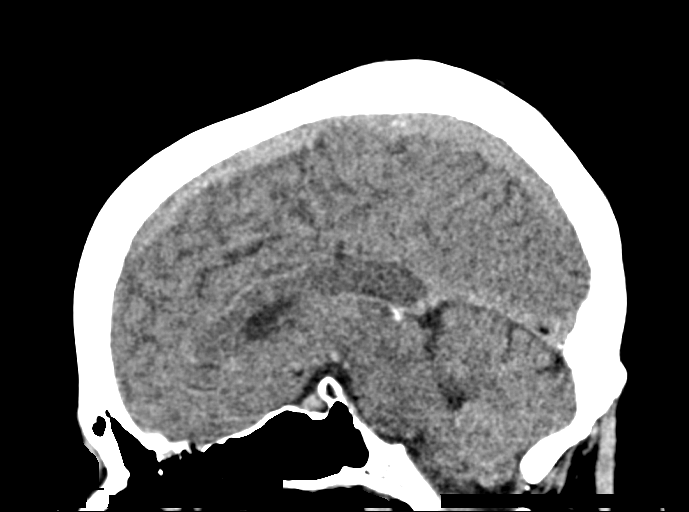
[im 45/67  brain]
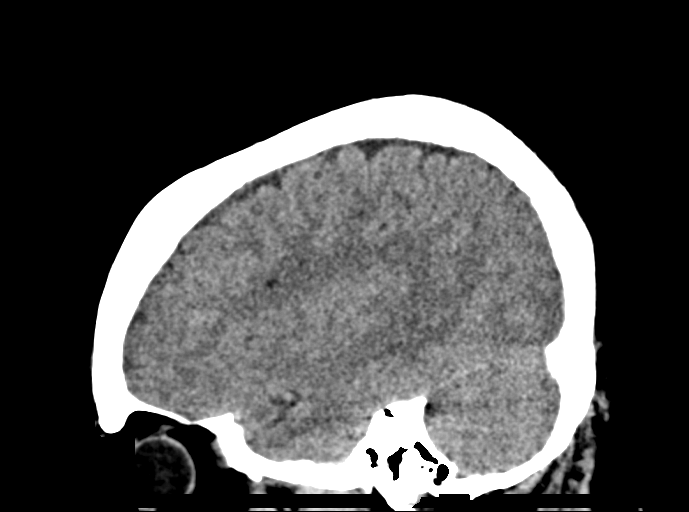

[15 of 47 positions shown; findings below may reference images not displayed]

FINDINGS: Brain: No intracranial hemorrhage, mass effect, or midline shift. No
hydrocephalus. The basilar cisterns are patent. Small remote infarct
in the right parietal lobe. No evidence of territorial infarct or
acute ischemia. No extra-axial or intracranial fluid collection.

Vascular: No hyperdense vessel.

Skull: No fracture or focal lesion.

Sinuses/Orbits: Opacification of lower right mastoid air cells.
Occasional mucosal thickening of paranasal sinuses.

Other: None.
IMPRESSION: 1. No acute intracranial abnormality. No skull fracture.
2. Small remote infarct in the right parietal lobe.

## 2022-11-06 ENCOUNTER — Other Ambulatory Visit: Payer: Self-pay | Admitting: Neurology

## 2022-12-15 ENCOUNTER — Other Ambulatory Visit: Payer: Self-pay | Admitting: Neurology

## 2022-12-16 MED ORDER — ARMODAFINIL 150 MG PO TABS: 150.0000 mg | ORAL_TABLET | Freq: Every day | ORAL | 0 refills | Status: DC

## 2023-08-17 ENCOUNTER — Encounter: Payer: Self-pay | Admitting: Neurology

## 2023-08-17 ENCOUNTER — Ambulatory Visit: Payer: BC Managed Care – PPO | Admitting: Neurology

## 2023-08-17 VITALS — BP 101/62 | HR 75 | Ht 71.0 in | Wt 193.0 lb

## 2023-08-17 DIAGNOSIS — R413 Other amnesia: Secondary | ICD-10-CM | POA: Insufficient documentation

## 2023-08-17 DIAGNOSIS — I680 Cerebral amyloid angiopathy: Secondary | ICD-10-CM

## 2023-08-17 DIAGNOSIS — I6340 Cerebral infarction due to embolism of unspecified cerebral artery: Secondary | ICD-10-CM

## 2023-08-17 DIAGNOSIS — G4733 Obstructive sleep apnea (adult) (pediatric): Secondary | ICD-10-CM

## 2023-08-17 DIAGNOSIS — E854 Organ-limited amyloidosis: Secondary | ICD-10-CM | POA: Diagnosis not present

## 2023-08-17 DIAGNOSIS — I71012 Dissection of descending thoracic aorta: Secondary | ICD-10-CM

## 2023-08-17 DIAGNOSIS — G4710 Hypersomnia, unspecified: Secondary | ICD-10-CM

## 2023-08-17 DIAGNOSIS — I639 Cerebral infarction, unspecified: Secondary | ICD-10-CM

## 2023-08-17 NOTE — Progress Notes (Signed)
Provider:  Melvyn Novas, MD  Primary Care Physician:  Mickeal Skinner, MD [address] [place] [address]     Referring Provider: Mickeal Skinner, Md [address] [place],  [place] [address]          Chief Complaint according to patient   Patient presents with:     New CPAP needed , I had a stroke last year and afterwards snoring came back , and I went back on CPAP . I had been off for about a year when I had the stroke.            HISTORY OF PRESENT ILLNESS: 12- 06-2023  Patrick Woodard is a 52 y.o. male patient who is here for revisit 08/17/2023 for  OSA and is a CVA patient with mild memory concerns, for STM> trouble with multi tasking  at work. He returned to his place of employment. .  Chief concern according to patient :  never got around to have a sleep study, needing one and new machine.     STROKE FOLLOW UP FOR SLEEP APNEA PATIENT :    I have the pleasure of meeting with Patrick Woodard today and his wife . This is 08 July 2022 and our visit is now not for sleep apnea for which I had seen the patient before but for a recent stroke.  The patient suffered an acute stroke on May 23, 2022 when he was admitted through the [facility] hospitalist service.  He was described as a 52 year old Caucasian male with a history of COPD, type 2 diabetes, hypertension, history of cerebral amyloid angiopathy, history of aortic dissection s/p repair 2014.  The patient had a sudden onset of chest pain for which EMS was called.  Upon their arrival they noticed he had profound right-sided weakness with right hemianopsia and extinction to sensory.   A code stroke was activated the weakness resolved in 10 minutes and his sensory extinction had improved by the time he reached the emergency room.  He had persistent right hemianopsia however.   His CT was negative for bleed a CTA was negative for large vessel occlusion, and with his history of aortic dissection and now with chest pain a CT  study was done the results were discussed with the cardiothoracic surgeon who felt there was no dissection at this time going on.  A brain MRI was then finally done and showed left hemispheric watershed and few infarction.  A clot Buster was not given due to more than 10 microhemorrhages surrounding the imaging side and the reported history of cerebral amyloid angiopathy.  Lab tests were quoted below the patient was given Ativan prior to the MRI due to claustrophobia he vomited multiple times needed Zofran and was finally also placed on 2 L of supplemental oxygen as his oxygenation dropped to the 80s.  He said the onset of right-sided chest pain was right after he was leaving a restaurant and he felt that his right arm went numb.   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   I also reviewed his CPAP compliance and that is spotty , he did not use his CPAP since ED visit.     Seen here upon referral  on 04/20/2022 from PCP/ occupational health MD for a new evaluation of [name] , and to establish treatment.    Patrick Woodard is a 52 y.o.  Caucasian male patient was originally seen by [facility] on 01-03-2017 , and prescribed CPAP with a FFM-  He was given a [employer] which was later recalled and replaced by a refurbished machine of unknown age in 2021-DME was [employer].  Sleep relevant medical/ surgical history or symptom history: Nocturia none , no Tonsillectomy: , no cervical spine injury, no deviated septum, some SOB.    Patrick Woodard is a 52 yo Caucasian male patient presenting with a past medical history of COPD (chronic obstructive pulmonary disease) (HCC), Diabetes mellitus (HCC), Dyspnea, Heart murmur, and Hypertension.n April 2018 he was evaluated by a  split-night polysomnography at [facility] long which was interpreted by Dr. Jetty Duhamel.  He had mild sleep apnea but the AHI for the split was set low and therefore he was titrated the same night.  After he left the sleep lab he received a [employer] auto titration device which was set at 13 cm water.  He has continued to use his machine with good compliance of about 77% average user time is 4-1/2 hours at night but he does not feel that it has the same restorative and refreshing quality reduced to half.  His average residual AHI or apnea hypopnea index per hour of sleep is 1.7 which is a good resolution he is using an ramp of 20 minutes, starting at 4 cmH2O heated tubing.  Humidification is at level 3.   The patient endorsed a very high degree of residual sleepiness today 16 out of 24 points and he clearly noted that his CPAP is helping him to sleep better however it used to have an even more significant effect.   Residual Epworth sleepiness score of 16 is considered excessively daytime sleepy (EDS) and I think that this patient needs to be evaluated for the degree of apnea again.   I wonder if he has hypoxemia or central apneas arising.   We also need to get a baseline in case that he qualifies for an inspire device as he is as he is interested in.  His original sleep study mentions no significant snoring which is suspicious to me and I do not think that for patient with a residual AHI of 1.7/h there should any persistent hypersomnia related to apnea be present.   So the question is also all the other causes of this degree of sleepiness.  I will order the baseline sleep study and new machine would be needed anyway should he decide to decide to continue with CPAP.     I have to explain to the patient that in case of : central apneas, significant oxygen desaturations or REM sleep dependent apnea an inspire device would not be used. The same is true for a dental device.     The patient had this  SPLIT  sleep study at [facility] in 4 -2018 :       Review of Systems: Out of a complete 14 system review, the patient complains of only the following symptoms, and all other reviewed systems are negative.:  Fatigue, sleepiness , snoring,  before CPAP , STM, distractability, multitasking challenges.    How likely are you to doze in the following situations: 0 = not likely, 1 = slight chance, 2 = moderate chance, 3 = high chance   Sitting and Reading?  Watching Television? Sitting inactive in a public place (theater or meeting)? As a passenger in a car for an hour without a break? Lying down in the afternoon when circumstances permit? Sitting and talking to someone? Sitting quietly after lunch without alcohol? In a car, while stopped for a few minutes in traffic?   Total = 8/ 24 points   FSS endorsed at 38/ 63 points.  GDS : not applicable   Social History   Socioeconomic History   Marital status: Married    Spouse name: Zella Ball   Number of children: 2   Years of education: Not on file   Highest education level: Some college, no degree  Occupational History   Not on file  Tobacco Use   Smoking status: Former    Current packs/day: 0.00    Types: Cigarettes    Quit date: 1992    Years since quitting: 32.9   Smokeless tobacco: Never   Tobacco comments:    smoked a little as a teenager  Vaping Use   Vaping status: Never Used  Substance and Sexual Activity   Alcohol use: Not Currently   Drug use: Not Currently   Sexual activity: Not on file  Other Topics Concern   Not on file  Social History Narrative   Lives at home with wife    L handed   Caffeine: rare   Social Determinants of Health   Financial Resource Strain: Not on file  Food Insecurity: Low Risk  (03/16/2023)   Received from [facility]   Hunger Vital Sign    Worried About Running Out of Food in the Last Year: Never true    Ran Out of Food in the Last Year: Never true  Transportation Needs: Not on file  (03/16/2023)  Physical Activity: Not on file  Stress: Not on file (07/12/2023)  Social Connections: Not on file    Family History  Problem Relation Age of Onset   High blood pressure Mother    Heart Problems Mother    Aortic dissection Father        x2   Heart Problems Paternal Aunt     Past Medical History:  Diagnosis Date   Aortic dissection, thoracic (HCC)    COPD (chronic obstructive pulmonary disease) (HCC)    Diabetes mellitus without complication (HCC)    patient denies   Dyspnea    with activity -    Heart murmur    Hypertension     Past Surgical History:  Procedure Laterality Date   CORONARY ARTERY BYPASS GRAFT  07/2013   repair of torn aorta   HAND SURGERY Left    injury   UMBILICAL HERNIA REPAIR N/A 12/10/2020   Procedure: LAPAROSCOPIC UMBILICAL HERNIA REPAIR WITH MESH;  Surgeon: Diamantina Monks, MD;  Location: [facility] OR;  Service: General;  Laterality: N/A;     Current Outpatient Medications on File Prior to Visit  Medication Sig Dispense Refill   aspirin 81 MG chewable tablet Chew 1 tablet (81 mg total) by mouth daily. 30 tablet 3   atorvastatin (LIPITOR) 10 MG tablet Take 10 mg by mouth daily.     clopidogrel (PLAVIX) 75 MG tablet Take 1 tablet (75 mg total) by mouth daily. 30 tablet 3   loratadine (CLARITIN) 10 MG tablet Take 10 mg by mouth daily.     metFORMIN (GLUCOPHAGE-XR) 500 MG 24 hr tablet Take 500 mg by mouth daily.     metoprolol tartrate (LOPRESSOR) 25 MG tablet Take 1.5 tablets by  mouth 2 (two) times daily.     montelukast (SINGULAIR) 10 MG tablet Take 10 mg by mouth daily.     Semaglutide (RYBELSUS) 3 MG TABS Take 3 mg by mouth daily.     sildenafil (VIAGRA) 100 MG tablet Take 100 mg by mouth at bedtime as needed for erectile dysfunction.     sotalol (BETAPACE) 80 MG tablet Take 80 mg by mouth 2 (two) times daily.     Armodafinil 150 MG tablet Take 1 tablet (150 mg total) by mouth daily. (Patient not taking: Reported on 08/17/2023) 30 tablet 0    No current facility-administered medications on file prior to visit.    Allergies  Allergen Reactions   Latex Dermatitis and Rash     DIAGNOSTIC DATA (LABS, IMAGING, TESTING) - I reviewed patient records, labs, notes, testing and imaging myself where available.  Lab Results  Component Value Date   WBC 10.1 05/24/2022   HGB 14.0 05/24/2022   HCT 39.9 05/24/2022   MCV 94.1 05/24/2022   PLT 161 05/24/2022      Component Value Date/Time   NA 136 05/24/2022 0328   NA 140 04/20/2022 1615   K 4.2 05/24/2022 0328   CL 104 05/24/2022 0328   CO2 23 05/24/2022 0328   GLUCOSE 172 (H) 05/24/2022 0328   BUN 12 05/24/2022 0328   BUN 15 04/20/2022 1615   CREATININE 1.01 05/24/2022 0328   CALCIUM 8.5 (L) 05/24/2022 0328   PROT 6.8 05/23/2022 2034   PROT 6.4 04/20/2022 1615   ALBUMIN 3.9 05/23/2022 2034   ALBUMIN 4.2 04/20/2022 1615   AST 22 05/23/2022 2034   ALT 22 05/23/2022 2034   ALKPHOS 109 05/23/2022 2034   BILITOT 0.7 05/23/2022 2034   BILITOT 0.6 04/20/2022 1615   GFRNONAA >60 05/24/2022 0328   Lab Results  Component Value Date   CHOL 93 05/25/2022   HDL 23 (L) 05/25/2022   LDLCALC 40 05/25/2022   TRIG 149 05/25/2022   CHOLHDL 4.0 05/25/2022   Lab Results  Component Value Date   HGBA1C 7.6 (H) 05/24/2022   No results found for: "VITAMINB12" Lab Results  Component Value Date   TSH 0.799 04/20/2022    PHYSICAL EXAM:  Today's Vitals   08/17/23 0931  BP: 101/62  Pulse: 75  Weight: 193 lb (87.5 kg)  Height: 5\' 11"  (1.803 m)   Body mass index is 26.92 kg/m.   Wt Readings from Last 3 Encounters:  08/17/23 193 lb (87.5 kg)  07/08/22 207 lb (93.9 kg)  07/07/22 207 lb (93.9 kg)     Ht Readings from Last 3 Encounters:  08/17/23 5\' 11"  (1.803 m)  07/08/22 5\' 11"  (1.803 m)  07/07/22 5' 11.5" (1.816 m)      General: The patient is awake, alert and appears not in acute distress. The patient is well groomed. Head: Normocephalic, atraumatic. Neck is  supple.  The patient is awake, alert and appears not in acute distress. The patient is well groomed. Head: Normocephalic, atraumatic. Neck is supple.  Mallampati 1,  neck circumference:17 inches . Nasal airflow slightly impaired-  but patent.  Retrognathia is not seen.  Dental status: biological teeth.  Cardiovascular:  Regular rate rhythm by pulse,  no distended neck veins. Respiratory: Lungs are clear to auscultation.  Skin:  Without evidence of ankle edema, or rash. Trunk: The patient's posture is erect.   Neurologic exam : The patient is awake and alert, oriented to place and time.   Memory subjective described  as intact.  Attention span & concentration ability appears normal.  Speech is fluent,  without dysarthria, dysphonia or aphasia.  Mood and affect are appropriate.   Cranial nerves: no loss of smell or taste reported  Pupils are equal and briskly reactive to light. Funduscopic exam deferred.   Extraocular movements in vertical and horizontal planes were intact- without nystagmus. HE HAS NO HEMIANOPSIA NOW and no Diplopia. Visual fields by finger perimetry are intact. Hearing was intact to soft voice and finger rubbing.    Facial sensation intact to fine touch.Facial motor strength is symmetric and tongue and uvula move midline.  Neck ROM : rotation, tilt and flexion extension were normal for age and shoulder shrug was symmetrical.    Motor exam:  Symmetric bulk, tone and ROM.   Normal tone without cog -wheeling, symmetric grip strength .   Sensory:  Fine touch and vibration were normal - he still feels numbness in the left arm and its coming and going.  Proprioception tested in the upper extremities was normal.   Coordination: Rapid alternating movements in the fingers/hands were of normal speed.  The Finger-to-nose maneuver was intact without evidence of ataxia, dysmetria or tremor.   Gait and station: Patient could rise unassisted from a seated position, walked without  assistive device.  Deep tendon reflexes: in the upper and lower extremities are symmetric and intact.  Babinski response was deferred.        ASSESSMENT AND PLAN 52 y.o. year old male  here with:  OSA on CPAP post CVA- has lost weight ( 45 pounds ) since initial sleep study under dr Maple Hudson in 2018.    1) we need a new baseline given the medical developments of stroke in 2023,  and will order a new CPAP based on results. His machine is noisy.  There are air leaks , ut he forgot to bring the chip here.   2)  I ordered a HST for Mr Jha . I will ask his DME to fit him for a more comfortable mask with hopefully less leaking.   3) I tested his Memory only by MMSE today, 30 out of 30- next visit will need to do a MOCA test.    I plan to follow up either personally or through our NP within 4 months.   I would like to thank Mickeal Skinner, MD and Mickeal Skinner, Md [address] [place],  [place] [address] for allowing me to meet with and to take care of this pleasant patient.     After spending a total time of  35  minutes face to face and additional time for physical and neurologic examination, review of laboratory studies,  personal review of imaging studies, reports and results of other testing and review of referral information / records as far as provided in visit,   Electronically signed by: Melvyn Novas, MD 08/17/2023 9:45 AM  [facility] Board certified by The [employer] of Sleep Medicine and Diplomate of the [employer] of Sleep Medicine. Board certified In Neurology through the ABPN, Fellow of the [employer] of Neurology.

## 2023-08-17 NOTE — Patient Instructions (Signed)
History of OSA, was heavier at the time of diagnosis, lost  45 pounds _ and stopped snoring, stopped CPAP. Then suffered a CVA.   we need a new baseline given the medical developments of stroke in 2023,  and will order a new CPAP based on results. His machine is noisy.  There are air leaks , ut he forgot to bring the chip here.   2)  I ordered a HST for Patrick Woodard . I will ask his DME to fit him for a more comfortable mask with hopefully less leaking.   3) I tested his Memory only by MMSE today, 30 out of 30- next visit will need to do a MOCA test.    I plan to follow up either personally or through our NP within 4 months.   I would like to thank Mickeal Skinner, MD and Mickeal Skinner, Md [address] [place],  [place] [address] for allowing me to meet with and to take care of this pleasant patient.

## 2023-08-26 ENCOUNTER — Ambulatory Visit: Payer: BC Managed Care – PPO | Admitting: Neurology

## 2023-08-26 DIAGNOSIS — I639 Cerebral infarction, unspecified: Secondary | ICD-10-CM

## 2023-08-26 DIAGNOSIS — G4733 Obstructive sleep apnea (adult) (pediatric): Secondary | ICD-10-CM

## 2023-08-26 DIAGNOSIS — I71012 Dissection of descending thoracic aorta: Secondary | ICD-10-CM

## 2023-08-26 DIAGNOSIS — E854 Organ-limited amyloidosis: Secondary | ICD-10-CM

## 2023-08-26 DIAGNOSIS — G4739 Other sleep apnea: Secondary | ICD-10-CM

## 2023-08-26 DIAGNOSIS — R413 Other amnesia: Secondary | ICD-10-CM

## 2023-08-26 DIAGNOSIS — I6340 Cerebral infarction due to embolism of unspecified cerebral artery: Secondary | ICD-10-CM

## 2023-08-26 DIAGNOSIS — I480 Paroxysmal atrial fibrillation: Secondary | ICD-10-CM

## 2023-09-06 ENCOUNTER — Ambulatory Visit: Payer: BC Managed Care – PPO | Admitting: Neurology

## 2023-09-07 DIAGNOSIS — G4739 Other sleep apnea: Secondary | ICD-10-CM | POA: Insufficient documentation

## 2023-09-09 NOTE — Telephone Encounter (Addendum)
 Bobbye Morton, CMA  Zott, Kennyth Arnold; Gamble, Tammy New orders have been placed for the above pt, DOB: [birth year] Thanks  Zott, Stacy  Scott Fix, Abbe Amsterdam, CMA; Melvern Sample Got It Thank You

## 2023-09-21 ENCOUNTER — Telehealth: Payer: Self-pay | Admitting: Neurology

## 2023-09-21 NOTE — Telephone Encounter (Signed)
 Pt was scheduled for his initial CPAP on (10/28/23) Pt was informed to bring machine and power cord to the appointment.   DME in pt's SnapShot.  between dates are 10/17/23-12/16/23

## 2023-09-30 ENCOUNTER — Ambulatory Visit: Payer: BC Managed Care – PPO | Admitting: Neurology

## 2023-10-27 ENCOUNTER — Other Ambulatory Visit: Payer: Self-pay | Admitting: Neurology

## 2023-10-27 ENCOUNTER — Encounter: Payer: Self-pay | Admitting: Neurology

## 2023-10-27 NOTE — Progress Notes (Unsigned)
Pt was set up through [facility] on 09/17/23

## 2023-10-28 ENCOUNTER — Telehealth: Payer: Self-pay | Admitting: Neurology

## 2023-10-28 ENCOUNTER — Encounter: Payer: Self-pay | Admitting: Neurology

## 2023-10-28 ENCOUNTER — Telehealth: Payer: BC Managed Care – PPO | Admitting: Neurology

## 2023-10-28 DIAGNOSIS — G4710 Hypersomnia, unspecified: Secondary | ICD-10-CM

## 2023-10-28 DIAGNOSIS — I71012 Dissection of descending thoracic aorta: Secondary | ICD-10-CM

## 2023-10-28 DIAGNOSIS — G4733 Obstructive sleep apnea (adult) (pediatric): Secondary | ICD-10-CM

## 2023-10-28 DIAGNOSIS — I680 Cerebral amyloid angiopathy: Secondary | ICD-10-CM

## 2023-10-28 DIAGNOSIS — E854 Organ-limited amyloidosis: Secondary | ICD-10-CM

## 2023-10-28 DIAGNOSIS — G4739 Other sleep apnea: Secondary | ICD-10-CM

## 2023-10-28 DIAGNOSIS — I639 Cerebral infarction, unspecified: Secondary | ICD-10-CM

## 2023-10-28 DIAGNOSIS — I480 Paroxysmal atrial fibrillation: Secondary | ICD-10-CM

## 2023-10-28 NOTE — Telephone Encounter (Signed)
LVM and sent mychart msg asking pt to call back to schedule follow up with NP for next year

## 2023-10-28 NOTE — Progress Notes (Signed)
Virtual Visit via Video Note  SLEEP CLINIC  I connected with Patrick Woodard on 10/28/23 at  9:00 AM EST by a video enabled telemedicine application and verified that I am speaking with the correct person using two identifiers.  Location: Patient: at work  Provider: at [employer]  STROKE FOLLOW UP FOR SLEEP APNEA PATIENT :    I discussed the limitations of evaluation and management by telemedicine and the availability of in person appointments. The patient expressed understanding and agreed to proceed.  History of Present Illness: STROKE FOLLOW UP FOR SLEEP APNEA PATIENT : Patrick Woodard is a 53 y.o. male patient who was seen on 08/17/2023 for OSA .He is a CVA patient with mild memory concerns, for STM> trouble with multi tasking  at work. He returned to his place of employment. .   " I had a stroke last year and afterwards snoring came back , and I went back on CPAP ". I had been off for about a year when I had the stroke. I never got around to have a sleep study, now needing one and needing a new machine."   HST was performed on  08-26-2023: Calculated pAHI (per hour):   Following AASM criteria the AHI was 32.3/h. The device calculated that approximately 14% of the sleep apneas were central sleep apneas also this is often much under-estimated calculated by the home sleep test device.                   REM pAHI:     35/h   NREM pAHI:   31/h  , no positional dependency either . This patient still needed positive airway pressure therapy for his severe sleep apnea.  Based on these results, a new CPAP was issued 09-2023.   Good results, see download of data below. Less sleepy, less fatigued.  Auto CPAP 6-16 cm water pressure with heated humidity.  He does not run out of water, the reservoir is sufficient.     Residual AHI is 1.1/h , 95% pressure is 10.2 cm water.  No aerophagia.   The patient reports that its taking a while for him feeling the pressure coming on.  I will eliminate the RAMP time for him.    He is now using an under- the -nose- FFM, but has facial hair and  keeps the beard short to reach better air seal.       Hx : The patient had previously a SPLIT sleep study at [facility] in 4 -2018 . 08 July 2022 :our visit is now not for sleep apnea for which I had seen the patient years ago,  but for a stroke.   The patient suffered an acute stroke on May 23, 2022 when he was admitted through the [facility] hospitalist service.  He was described as a 53 year old Caucasian male with a history of COPD, type 2 diabetes, hypertension, history of cerebral amyloid angiopathy, history of aortic dissection s/p repair 2014.  The patient had a sudden onset of chest pain for which EMS was called.  Upon their arrival they noticed he had profound right-sided weakness with right hemianopsia and extinction to sensory.   A code stroke was activated the weakness resolved in 10 minutes and his sensory extinction had improved by the time he reached the emergency room.  He had persistent right hemianopsia however. I had a stroke last year and afterwards snoring came back , and I went back on CPAP . I had been off for about a  year when I had the stroke.    OSA on CPAP post CVA- has lost weight ( 45 pounds ) since initial sleep study under Dr Maple Hudson in 2018. we need a new baseline given the medical developments of stroke in 2023,  and will order a new CPAP based on results. His machine is noisy. There are air leaks reported  , but he forgot to bring the DATA-chip here.       Observations/Objective: Sleepiness was higher without CPAP therapy and is now corrected. ESS 6/ 24. Was 8/ 24 last visit FSS at 38/ 63 points.  This patient still needed positive airway pressure therapy for his severe sleep apnea.  Based on these results, a new CPAP was issued 09-2023.   Good results, see download of data below. Less sleepy, less fatigued.  Auto CPAP 6-16 cm water pressure with heated humidity.  He does not run out of water, the  reservoir is sufficient.     Residual AHI is 1.1/h , 95% pressure is 10.2 cm water.  No aerophagia.   The patient reports that its taking a while for him feeling the pressure coming on.  I will eliminate the RAMP time for him.   He is now using an under- the -nose- FFM, but has facial hair and  keeps the beard short to reach better air seal.     Assessment and Plan: continue with CPAP use. I will ask your DME to reduce to eliminate the RAMP function or reduce it to 5 minutes.    Follow Up Instructions: Rv in 12 months with NP or me.     I discussed the assessment and treatment plan with the patient. The patient was provided an opportunity to ask questions and all were answered. The patient agreed with the plan and demonstrated an understanding of the instructions.   The patient was advised to call back or seek an in-person evaluation if the symptoms worsen or if the condition fails to improve as anticipated.  I provided 12 minutes of non-face-to-face time during this encounter.   Melvyn Novas, MD [facility] Board certified by The [employer] of Sleep Medicine and Diplomate of the [employer] of Sleep Medicine. Board certified In Neurology through the ABPN, Fellow of the [employer] Neu[employer].

## 2024-05-19 ENCOUNTER — Ambulatory Visit
Admission: EM | Admit: 2024-05-19 | Discharge: 2024-05-19 | Disposition: A | Discharge status: Home / Self Care | Attending: Family Medicine | Admitting: Family Medicine

## 2024-05-19 ENCOUNTER — Ambulatory Visit (INDEPENDENT_AMBULATORY_CARE_PROVIDER_SITE_OTHER)

## 2024-05-19 ENCOUNTER — Other Ambulatory Visit: Payer: Self-pay

## 2024-05-19 DIAGNOSIS — R509 Fever, unspecified: Secondary | ICD-10-CM | POA: Diagnosis not present

## 2024-05-19 DIAGNOSIS — R059 Cough, unspecified: Secondary | ICD-10-CM | POA: Diagnosis not present

## 2024-05-19 DIAGNOSIS — R0789 Other chest pain: Secondary | ICD-10-CM | POA: Diagnosis not present

## 2024-05-19 DIAGNOSIS — J069 Acute upper respiratory infection, unspecified: Secondary | ICD-10-CM | POA: Diagnosis not present

## 2024-05-19 LAB — POC SARS CORONAVIRUS 2 AG -  ED: SARS Coronavirus 2 Ag: NEGATIVE

## 2024-05-19 LAB — POCT INFLUENZA A/B
Influenza A, POC: NEGATIVE
Influenza B, POC: NEGATIVE

## 2024-05-19 MED ORDER — CEFDINIR 300 MG PO CAPS: 300.0000 mg | ORAL_CAPSULE | Freq: Two times a day (BID) | ORAL | 0 refills | Status: AC

## 2024-05-19 NOTE — ED Provider Notes (Signed)
 [facility]    CSN: [id number] Arrival date & time: 05/19/24  1754      History   Chief Complaint Chief Complaint  Patient presents with   Fever   Cough   Sore Throat    HPI Patrick Woodard is a 53 y.o. male.   Yesterday patient awoke with sore throat followed by a cough and mild headache later in the day.  Today he developed chills, myalgias, fatigue, and mild rhinorrhea.  He has also developed a vague intermittent sharp right pain over his right scapula. Patient has a history of several episodes of pneumonia (last episode 4 to 5 years ago). 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  The history is provided by the patient and the spouse.    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/08/2022   Poor compliance with CPAP treatment 07/08/2022   Chest pain 05/24/2022   Acute hypoxemic respiratory failure (HCC) 05/24/2022   Emesis 05/24/2022   COPD (chronic obstructive pulmonary disease) (HCC) 05/24/2022   Type 2 diabetes mellitus (HCC) 05/24/2022   Essential hypertension 05/24/2022   Hyperlipidemia 05/24/2022   Acute CVA (cerebrovascular accident) (HCC) 05/23/2022   OSA on CPAP 04/20/2022   Persistent hypersomnia 04/20/2022    Past Surgical History:  Procedure Laterality Date   CORONARY ARTERY BYPASS GRAFT   07/2013   repair of torn aorta   HAND SURGERY Left    injury   UMBILICAL HERNIA REPAIR N/A 12/10/2020   Procedure: LAPAROSCOPIC UMBILICAL HERNIA REPAIR WITH MESH;  Surgeon: Paola Dreama SAILOR, MD;  Location: [facility] OR;  Service: General;  Laterality: N/A;       Home Medications    Prior to Admission medications   Medication Sig Start Date End Date Taking? Authorizing Provider  cefdinir  (OMNICEF ) 300 MG capsule Take 1 capsule (300 mg total) by mouth 2 (two) times daily. 05/19/24  Yes Pauline Garnette LABOR, MD  aspirin  81 MG chewable tablet Chew 1 tablet (81 mg total) by mouth daily. 05/26/22   Drusilla Sabas RAMAN, MD  atorvastatin  (LIPITOR) 10 MG tablet Take 10 mg by mouth daily.    [provider]  clopidogrel  (PLAVIX ) 75 MG tablet Take 1 tablet (75 mg total) by mouth daily. 05/26/22   Drusilla Sabas RAMAN, MD  escitalopram (LEXAPRO) 10 MG tablet Take 10 mg by mouth daily. 09/13/23 12/12/23  [provider]  loratadine (CLARITIN) 10 MG tablet Take 10 mg by mouth daily.    [provider]  metFORMIN (GLUCOPHAGE-XR) 500 MG 24 hr tablet Take 500 mg by mouth daily. 05/27/22   [provider]  metoprolol tartrate (LOPRESSOR) 25 MG tablet Take 1.5 tablets by mouth 2 (two) times daily. 01/12/22   [provider]  montelukast (SINGULAIR) 10 MG tablet Take 10 mg by mouth daily.    [provider]  Semaglutide 7 MG TABS Take 7 mg by mouth daily. 05/27/22   [provider]  sildenafil (VIAGRA) 100 MG tablet Take 100 mg by mouth at bedtime as needed for erectile dysfunction. 05/05/22   [provider]  sotalol (BETAPACE) 80 MG tablet Take 80 mg by mouth 2 (two) times daily.    [provider]    Family History Family History  Problem Relation Age of Onset   High blood pressure Mother    Heart Problems Mother    Aortic dissection Father        x2   Heart Problems Paternal Aunt     Social History Social History   Tobacco Use   Smoking status:  Former    Current packs/day: 0.00    Types: Cigarettes    Quit date: 1992    Years since quitting: 33.7   Smokeless tobacco: Never   Tobacco comments:    smoked a little as a teenager  Vaping Use   Vaping status: Never Used  Substance Use Topics   Alcohol use: Not Currently   Drug use: Not Currently     Allergies   Latex   Review of Systems Review of Systems + sore throat + cough No pleuritic pain No wheezing + nasal congestion No post-nasal drainage No sinus pain/pressure No itchy/red eyes No earache No hemoptysis No SOB ? fever, + chills No nausea No vomiting No abdominal pain No diarrhea No urinary symptoms No skin rash + fatigue + myalgias + headache   Physical Exam Triage Vital Signs ED Triage Vitals  Encounter Vitals Group     BP 05/19/24 1844 134/78     Girls Systolic BP Percentile --      Girls Diastolic BP Percentile --      Boys Systolic BP Percentile --      Boys Diastolic BP Percentile --      Pulse Rate 05/19/24 1844 84     Resp 05/19/24 1844 16     Temp 05/19/24 1844 99.5 F (37.5 C)     Temp Source 05/19/24 1959 Oral     SpO2 05/19/24 1844 95 %     Weight --      Height --      Head Circumference --      Peak Flow --      Pain Score 05/19/24 1847 7     Pain Loc --      Pain Education --      Exclude from Growth Chart --    No data found.  Updated Vital Signs BP 134/78   Pulse 84   Temp 98.1 F (36.7 C) (Oral)   Resp 16   SpO2 95%   Visual Acuity Right Eye Distance:   Left Eye Distance:   Bilateral Distance:    Right Eye Near:   Left Eye Near:    Bilateral Near:     Physical Exam Nursing notes and Vital Signs reviewed. Appearance:  Patient appears stated age, and in no acute distress Eyes:  Pupils are equal, round, and reactive to light and accomodation.  Extraocular movement is intact.  Conjunctivae are not inflamed  Ears:  Canals normal.  Tympanic membranes normal.  Nose:  Mildly congested turbinates.  No  sinus tenderness.  Pharynx:  Normal Neck:  Supple. No adenopathy. Lungs:  Clear to auscultation.  Breath sounds are equal.  Moving air well. Back:  No tenderness to palpation over right scapula. Heart:  Regular rate and  rhythm without murmurs, rubs, or gallops.  Abdomen:  Nontender without masses or hepatosplenomegaly.  Bowel sounds are present.  No CVA or flank tenderness.  Extremities:  No edema.  Skin:  No rash present.   UC Treatments / Results  Labs (all labs ordered are listed, but only abnormal results are displayed) Labs Reviewed  POCT INFLUENZA A/B negative  POC SARS CORONAVIRUS 2 AG -  ED negative    EKG   Radiology DG Chest 2 View Result Date: 05/19/2024 EXAM: 2 VIEW(S) XRAY OF THE CHEST 05/19/2024 08:00:00 PM COMPARISON: None available. CLINICAL HISTORY: URI symptoms 2 days. Pain right scapular area. Cough, fever x 2 days. Right scapular pain. FINDINGS: LUNGS AND PLEURA: Lungs are clear. No pneumothorax or pleural effusion. HEART AND MEDIASTINUM: Cardiac size within normal limits. Pulmonary vascularity is normal. Status post cardiac surgery with sternotomy. BONES AND SOFT TISSUES: No acute bone abnormality. IMPRESSION: 1. No acute process. Electronically signed by: Dorethia Molt MD 05/19/2024 08:04 PM EDT RP Workstation: [id number]    Procedures Procedures (including critical care time)  Medications Ordered in UC Medications - No data to display  Initial Impression / Assessment and Plan / UC Course  I have reviewed the triage vital signs and the nursing notes.  Pertinent labs & imaging results that were available during my care of the patient were reviewed by me and considered in my medical decision making (see chart for details).    Chest X-ray shows no acute process. Because of patient's history of several episodes of pneumonia, will begin Omnicef  for one week.  Followup with Family Doctor if not improved in one week.  Because of history of ascending aortic  dissection repair, advised patient to seek immediate evaluation if he develops increasing pain in his right scapular area or back.  Final Clinical Impressions(s) / UC Diagnoses   Final diagnoses:  Viral upper respiratory tract infection     Discharge Instructions      Take plain guaifenesin (1200mg  extended release tabs such as Mucinex) twice daily, with plenty of water, for cough and congestion.  Get adequate rest.   For sinus congestion may use Afrin nasal spray (or generic oxymetazoline) each morning for about 5 days and then discontinue.  Also recommend using saline nasal spray several times daily and saline nasal irrigation (AYR is a common brand).  Use Flonase nasal spray each morning after using Afrin nasal spray and saline nasal irrigation. Try warm salt water gargles for sore throat.  May take Delsym Cough Suppressant (12 Hour Cough Relief) at bedtime for nighttime cough.    If symptoms become significantly worse during the night or over the weekend, proceed to the local emergency room.      ED Prescriptions     Medication Sig Dispense Auth. Provider   cefdinir  (OMNICEF ) 300 MG capsule Take 1 capsule (300 mg total) by mouth 2 (two) times daily. 14 capsule Pauline Garnette LABOR, MD         Pauline Garnette LABOR, MD 05/20/24 [phone]

## 2024-05-19 NOTE — ED Triage Notes (Signed)
 Started with sore throat yesterday, today has had chills, sharp right shoulder blade pain, coughing, runny nose. Has felt feverish but did not check temperature. No otc meds.

## 2024-05-19 NOTE — Discharge Instructions (Signed)
 Take plain guaifenesin (1200mg  extended release tabs such as Mucinex) twice daily, with plenty of water, for cough and congestion.  Get adequate rest.   For sinus congestion may use Afrin nasal spray (or generic oxymetazoline) each morning for about 5 days and then discontinue.  Also recommend using saline nasal spray several times daily and saline nasal irrigation (AYR is a common brand).  Use Flonase nasal spray each morning after using Afrin nasal spray and saline nasal irrigation. Try warm salt water gargles for sore throat.  May take Delsym Cough Suppressant (12 Hour Cough Relief) at bedtime for nighttime cough.    If symptoms become significantly worse during the night or over the weekend, proceed to the local emergency room.

## 2024-05-20 ENCOUNTER — Telehealth: Payer: Self-pay

## 2024-05-20 NOTE — Telephone Encounter (Signed)
Called to check on patient. No answer. Left voicemail.
